# Patient Record
Sex: Female | Born: 1950 | Hispanic: No | Marital: Married | Smoking: Never smoker
Health system: Western US, Community
[De-identification: ages and names within clinical notes are randomized; demographics above are authoritative.]

---

## 2008-05-03 LAB — CBC WITH AUTO DIFFERENTIAL
Basophils %: 1 % (ref 0–2)
Basophils, Absolute: 0 10*3/uL (ref 0–0.2)
Eosinophils %: 1 % (ref 0–7)
Eosinophils, Absolute: 0.1 10*3/uL (ref 0–0.7)
HCT: 38 % (ref 37.0–48.0)
Hgb: 13.3 gm/dL (ref 12.0–16.0)
Lymphocytes %: 48 % — ABNORMAL HIGH (ref 25–45)
Lymphocytes, Absolute: 3.3 10*3/uL (ref 1.1–4.3)
MCH: 31.6 pg (ref 27–34)
MCHC: 35 gm/dL (ref 32–36)
MCV: 90.4 fL (ref 81–99)
MPV: 8.9 fL (ref 7.4–10.4)
Monocytes %: 7 % (ref 0–12)
Monocytes, Absolute: 0.5 10*3/uL (ref 0–1.2)
Neutrophils, Absolute: 2.9 10*3/uL (ref 1.6–7.3)
Platelet Count: 264 10*3/uL (ref 150–400)
RBC: 4.2 10*6/uL (ref 4.20–5.40)
RDW: 12.6 % (ref 11.5–14.5)
Segs (Neutrophils)%: 43 % (ref 35–70)
WBC: 6.8 10*3/uL (ref 4.8–10.8)

## 2008-05-03 LAB — BASIC METABOLIC PANEL
Anion Gap: 8 mmol/L (ref 3–11)
BUN: 15 mg/dL (ref 8–21)
CO2 - Carbon Dioxide: 29 mmol/L (ref 22–31)
Calcium: 9.5 mg/dL (ref 8.5–10.5)
Chloride: 102 mmol/L (ref 98–111)
Creatinine, Serum: 0.8 mg/dL (ref 0.6–1.3)
GFR Estimate: >60 mL/min/{1.73_m2} (ref >60)
Glucose: 110 mg/dL — ABNORMAL HIGH (ref 80–99)
Potassium: 4.1 mmol/L (ref 3.5–5.1)
Sodium: 139 mmol/L (ref 133–147)

## 2012-11-28 ENCOUNTER — Ambulatory Visit: Admit: 2012-11-28 | Discharge: 2012-11-28 | Payer: BC Managed Care – PPO

## 2012-11-28 DIAGNOSIS — Z139 Encounter for screening, unspecified: Secondary | ICD-10-CM

## 2013-01-23 ENCOUNTER — Inpatient Hospital Stay: Admit: 2013-01-23 | Payer: BC Managed Care – PPO

## 2013-01-23 ENCOUNTER — Ambulatory Visit: Admit: 2013-01-23 | Payer: BC Managed Care – PPO

## 2013-01-23 DIAGNOSIS — D3A090 Benign carcinoid tumor of the bronchus and lung: Secondary | ICD-10-CM

## 2013-01-23 LAB — PLATELET COUNT: Platelet Count: 237 10*3/uL (ref 150–400)

## 2013-01-23 LAB — PROTIME-INR
INR: 1 (ref 0.9–1.2)
Protime: 10.5 s (ref 10.0–13.0)

## 2013-01-23 LAB — APTT: APTT: 29 s (ref 23–34)

## 2013-01-23 MED ORDER — morphine syringe 4 mg
4 | INTRAMUSCULAR | Status: DC | PRN
Start: 2013-01-23 — End: 2013-01-23

## 2013-01-23 MED ORDER — fentaNYL (SUBLIMAZE) 50 mcg/mL injection
50 | INTRAMUSCULAR | Status: DC
Start: 2013-01-23 — End: 2013-01-23

## 2013-01-23 MED ORDER — HYDROcodone-acetaminophen (NORCO) 5-325 mg per tablet 1-2 tablet
5-325 | ORAL | Status: DC | PRN
Start: 2013-01-23 — End: 2013-01-23

## 2013-01-23 MED ORDER — HYDROcodone-acetaminophen (NORCO) 5-325 mg per tablet
5-325 | ORAL_TABLET | ORAL | 0.00 refills | Status: AC | PRN
Start: 2013-01-23 — End: 2013-02-02

## 2013-01-23 MED ORDER — ondansetron (ZOFRAN) 4 mg/2 mL injection 4 mg
4 | INTRAMUSCULAR | Status: DC | PRN
Start: 2013-01-23 — End: 2013-01-23
  Administered 2013-01-23: 20:00:00 4 mg via INTRAVENOUS

## 2013-01-23 MED ORDER — midazolam (VERSED) 1 mg/mL injection
1 | INTRAMUSCULAR | Status: AC
Start: 2013-01-23 — End: 2013-01-23
  Administered 2013-01-23: 19:00:00 1 mg/mL

## 2013-01-23 MED ORDER — fentaNYL (SUBLIMAZE) 50 mcg/mL injection
50 | INTRAMUSCULAR | Status: AC
Start: 2013-01-23 — End: 2013-01-23
  Administered 2013-01-23: 19:00:00 50 mcg/mL

## 2013-01-23 MED FILL — ONDANSETRON HCL (PF) 4 MG/2 ML INJECTION SOLUTION: 4 | INTRAMUSCULAR | Qty: 2 | Fill #0

## 2013-01-23 MED FILL — MIDAZOLAM (PF) 1 MG/ML INJECTION SOLUTION: 1 mg/mL | INTRAMUSCULAR | Qty: 5 | Fill #0

## 2013-01-23 MED FILL — FENTANYL (PF) 50 MCG/ML INJECTION SOLUTION: 50 ug/mL | INTRAMUSCULAR | Qty: 2 | Fill #0

## 2013-01-28 ENCOUNTER — Ambulatory Visit: Admit: 2013-01-28 | Discharge: 2013-01-28 | Payer: BC Managed Care – PPO

## 2013-01-28 DIAGNOSIS — R911 Solitary pulmonary nodule: Secondary | ICD-10-CM

## 2013-02-19 ENCOUNTER — Inpatient Hospital Stay: Admit: 2013-02-19 | Payer: BC Managed Care – PPO

## 2013-02-19 DIAGNOSIS — R911 Solitary pulmonary nodule: Secondary | ICD-10-CM

## 2013-02-19 MED ORDER — iohexol (OMNIPAQUE) 350 mg iodine/mL injection 125 mL
350 | Freq: Once | INTRAVENOUS | Status: AC | PRN
Start: 2013-02-19 — End: 2013-02-19
  Administered 2013-02-19: 18:00:00 350 mL via INTRAVENOUS

## 2013-04-03 ENCOUNTER — Ambulatory Visit: Admit: 2013-04-03 | Discharge: 2013-04-03 | Payer: BC Managed Care – PPO | Attending: MD

## 2013-04-03 DIAGNOSIS — C342 Malignant neoplasm of middle lobe, bronchus or lung: Secondary | ICD-10-CM

## 2013-04-22 ENCOUNTER — Encounter: Payer: BC Managed Care – PPO | Attending: Family

## 2013-07-22 ENCOUNTER — Encounter: Payer: BC Managed Care – PPO | Attending: Family

## 2013-08-05 ENCOUNTER — Inpatient Hospital Stay: Admit: 2013-08-05 | Payer: BC Managed Care – PPO | Attending: MD

## 2013-08-05 ENCOUNTER — Ambulatory Visit: Admit: 2013-08-05 | Payer: BC Managed Care – PPO | Attending: Family

## 2013-08-05 ENCOUNTER — Inpatient Hospital Stay: Admit: 2013-08-05 | Payer: BC Managed Care – PPO | Attending: Family

## 2013-08-05 DIAGNOSIS — C3490 Malignant neoplasm of unspecified part of unspecified bronchus or lung: Secondary | ICD-10-CM

## 2013-08-05 LAB — CBC WITH AUTO DIFFERENTIAL
Basophils %: 1 % (ref 0–2)
Basophils, Absolute: 0.1 10*3/uL (ref 0–0.2)
Eosinophils %: 2 % (ref 0–7)
Eosinophils, Absolute: 0.1 10*3/uL (ref 0–0.7)
HCT: 38.7 % (ref 37.0–48.0)
Hgb: 13.4 gm/dL (ref 12.0–16.0)
Lymphocytes %: 41 % (ref 25–45)
Lymphocytes, Absolute: 2.5 10*3/uL (ref 1.1–4.3)
MCH: 31.4 pg (ref 27–34)
MCHC: 34.5 gm/dL (ref 32–36)
MCV: 91 fL (ref 81–99)
MPV: 9.7 fL (ref 7.4–10.4)
Monocytes %: 6 % (ref 0–12)
Monocytes, Absolute: 0.4 10*3/uL (ref 0–1.2)
Neutrophils, Absolute: 3.1 10*3/uL (ref 1.6–7.3)
Platelet Count: 240 10*3/uL (ref 150–400)
RBC: 4.26 10*6/uL (ref 4.20–5.40)
RDW: 13.2 % (ref 11.5–14.5)
Segs (Neutrophils)%: 50 % (ref 35–70)
WBC: 6.1 10*3/uL (ref 4.8–10.8)

## 2013-08-05 LAB — COMPREHENSIVE METABOLIC PANEL
ALT - Alanine Amino transferase: 27 IU/L (ref 10–45)
AST - Aspartate Aminotransferase: 29 IU/L (ref 10–42)
Albumin/Globulin Ratio: 1.3 (ref >0.9)
Albumin: 4.2 gm/dL (ref 3.5–5.0)
Alkaline Phosphatase: 47 IU/L (ref 37–107)
Anion Gap: 7 mmol/L (ref 3–11)
BUN: 13 mg/dL (ref 8–21)
Bilirubin, Total: 0.6 mg/dL (ref 0.2–1.4)
CO2 - Carbon Dioxide: 28 mmol/L (ref 22–31)
Calcium: 9.7 mg/dL (ref 8.6–10.0)
Chloride: 105 mmol/L (ref 98–111)
Creatinine, Serum: 0.88 mg/dL (ref 0.44–1.03)
GFR Estimate: >60 mL/min/{1.73_m2} (ref >60)
Globulin: 3.2 gm/dL (ref 2.2–3.7)
Glucose: 89 mg/dL (ref 80–99)
Potassium: 3.9 mmol/L (ref 3.5–5.1)
Protein, Total: 7.4 gm/dL (ref 6.1–7.9)
Sodium: 140 mmol/L (ref 135–143)

## 2013-08-05 LAB — TYPE AND SCREEN
ABO/Rh (D): A POS
Antibody Screen: NEGATIVE
Unit Division: 0
Unit Division: 0

## 2013-08-05 LAB — PROTIME-INR
INR: 1 (ref 0.9–1.2)
Protime: 10.8 s (ref 10.0–13.0)

## 2013-08-05 LAB — MRSA BY PCR: MRSA DNA Result: NEGATIVE

## 2013-08-06 ENCOUNTER — Inpatient Hospital Stay: Admit: 2013-08-06 | Payer: BC Managed Care – PPO

## 2013-08-06 ENCOUNTER — Inpatient Hospital Stay
Admit: 2013-08-06 | Discharge: 2013-08-09 | Disposition: A | Discharge status: Home / Self Care | Payer: BC Managed Care – PPO | Source: Ambulatory Visit | Attending: MD | Admitting: MD

## 2013-08-06 DIAGNOSIS — C7A090 Malignant carcinoid tumor of the bronchus and lung: Secondary | ICD-10-CM

## 2013-08-06 LAB — VRE CULTURE

## 2013-08-06 MED ORDER — magnesium hydroxide (MILK OF MAGNESIA) 400 mg/5 mL oral suspension 30 mL
400 | ORAL | Status: DC | PRN
Start: 2013-08-06 — End: 2013-08-06

## 2013-08-06 MED ORDER — ceFAZolin (ANCEF) in D5W IVPB 2 g/50 mL
2 | Freq: Three times a day (TID) | INTRAVENOUS | Status: AC
Start: 2013-08-06 — End: 2013-08-07
  Administered 2013-08-07 (×2): 250 g via INTRAVENOUS

## 2013-08-06 MED ORDER — fentaNYL (SUBLIMAZE) 50 mcg/mL injection
50 | INTRAMUSCULAR | Status: DC | PRN
Start: 2013-08-06 — End: 2013-08-06

## 2013-08-06 MED ORDER — chlorhexidine (PERIDEX) 0.12 % solution
0.12 | Status: AC
Start: 2013-08-06 — End: ?

## 2013-08-06 MED ORDER — sodium chloride 0.9% (NS) syringe 5 mL
INTRAMUSCULAR | Status: DC | PRN
Start: 2013-08-06 — End: 2013-08-06

## 2013-08-06 MED ORDER — chlorhexidine (PERIDEX) 0.12 % solution 15 mL
0.12 | Freq: Once | Status: AC
Start: 2013-08-06 — End: 2013-08-06

## 2013-08-06 MED ORDER — aluminum-magnesium hydroxide-simethicone (MAALOX PLUS) 200-200-20 mg/5 mL oral suspension 30 mL
200-200-20 | ORAL | Status: DC | PRN
Start: 2013-08-06 — End: 2013-08-06

## 2013-08-06 MED ORDER — propofol (DIPRIVAN) injection
10 | INTRAVENOUS | Status: DC | PRN
Start: 2013-08-06 — End: 2013-08-06

## 2013-08-06 MED ORDER — ondansetron (ZOFRAN) injection 4 mg
4 | Freq: Four times a day (QID) | INTRAMUSCULAR | Status: DC | PRN
Start: 2013-08-06 — End: 2013-08-08

## 2013-08-06 MED ORDER — magnesium hydroxide (MILK OF MAGNESIA) 400 mg/5 mL oral suspension 30 mL
400 | ORAL | Status: DC | PRN
Start: 2013-08-06 — End: 2013-08-07

## 2013-08-06 MED ORDER — acetaminophen (TYLENOL) tablet 650 mg
325 | ORAL | Status: DC | PRN
Start: 2013-08-06 — End: 2013-08-09

## 2013-08-06 MED ORDER — ceFAZolin (ANCEF) IV syringe 2 g
2 | Freq: Once | INTRAVENOUS | Status: DC
Start: 2013-08-06 — End: 2013-08-06

## 2013-08-06 MED ORDER — Normal saline infusion for medication/blood product administration for nursing
0.9 | INTRAVENOUS | Status: DC | PRN
Start: 2013-08-06 — End: 2013-08-07

## 2013-08-06 MED ORDER — dextrose 5 % in lactated ringers infusion
INTRAVENOUS | Status: DC | PRN
Start: 2013-08-06 — End: 2013-08-07

## 2013-08-06 MED ORDER — nalOXone (NARCAN) injection 0.08 mg
0.4 | INTRAMUSCULAR | Status: DC | PRN
Start: 2013-08-06 — End: 2013-08-08

## 2013-08-06 MED ORDER — midazolam (VERSED) 1 mg/mL injection
1 | INTRAMUSCULAR | Status: AC
Start: 2013-08-06 — End: ?

## 2013-08-06 MED ORDER — ceFAZolin (ANCEF) IV syringe 1 g
1 | INTRAVENOUS | Status: DC | PRN
Start: 2013-08-06 — End: 2013-08-06

## 2013-08-06 MED ORDER — metoprolol (LOPRESSOR) tablet 25 mg
50 | Freq: Once | ORAL | Status: DC
Start: 2013-08-06 — End: 2013-08-06

## 2013-08-06 MED ORDER — sodium chloride 0.9% (NS) syringe 5 mL
Freq: Three times a day (TID) | INTRAMUSCULAR | Status: DC
Start: 2013-08-06 — End: 2013-08-06

## 2013-08-06 MED ORDER — acetaminophen (TYLENOL) tablet 650 mg
325 | ORAL | Status: DC | PRN
Start: 2013-08-06 — End: 2013-08-06

## 2013-08-06 MED ORDER — sodium phosphates (FLEETS) 19-7 gram/118 mL enema 133 mL
19 | Freq: Once | RECTAL | Status: DC
Start: 2013-08-06 — End: 2013-08-06

## 2013-08-06 MED ORDER — ALPRAZolam (XANAX) tablet 0.25 mg
0.25 | Freq: Four times a day (QID) | ORAL | Status: DC | PRN
Start: 2013-08-06 — End: 2013-08-06

## 2013-08-06 MED ORDER — oxyCODONE (ROXICODONE) tablet 5-10 mg
5 | ORAL | Status: DC | PRN
Start: 2013-08-06 — End: 2013-08-06

## 2013-08-06 MED ORDER — sodium chloride 0.9% (NS) syringe 5 mL
INTRAMUSCULAR | Status: DC | PRN
Start: 2013-08-06 — End: 2013-08-07

## 2013-08-06 MED ORDER — ondansetron (ZOFRAN) injection
4 | INTRAMUSCULAR | Status: DC | PRN
Start: 2013-08-06 — End: 2013-08-06

## 2013-08-06 MED ORDER — rocuronium (ZEMURON) injection
10 | INTRAVENOUS | Status: DC | PRN
Start: 2013-08-06 — End: 2013-08-06

## 2013-08-06 MED ORDER — prochlorperazine (COMPAZINE) injection 10 mg
10 | Freq: Four times a day (QID) | INTRAMUSCULAR | Status: DC | PRN
Start: 2013-08-06 — End: 2013-08-06

## 2013-08-06 MED ORDER — fentaNYL (SUBLIMAZE) 50 mcg/mL injection 25-50 mcg
50 | INTRAMUSCULAR | Status: DC | PRN
Start: 2013-08-06 — End: 2013-08-06

## 2013-08-06 MED ORDER — fentanyl-bupivacaine-nacl 4 mcg/mL-0.1 % epidural
EPIDURAL | Status: AC
Start: 2013-08-06 — End: ?

## 2013-08-06 MED ORDER — lidocaine (XYLOCAINE) 20 mg/mL (2 %) injection 92 mg
20 | INTRAVENOUS | Status: DC | PRN
Start: 2013-08-06 — End: 2013-08-07

## 2013-08-06 MED ORDER — temazepam (RESTORIL) capsule 15 mg
15 | Freq: Every evening | ORAL | Status: DC | PRN
Start: 2013-08-06 — End: 2013-08-06

## 2013-08-06 MED ORDER — ondansetron (ZOFRAN) injection 4 mg
4 | Freq: Four times a day (QID) | INTRAMUSCULAR | Status: DC | PRN
Start: 2013-08-06 — End: 2013-08-06

## 2013-08-06 MED ORDER — furosemide (LASIX) injection 20 mg
10 | INTRAMUSCULAR | Status: DC | PRN
Start: 2013-08-06 — End: 2013-08-07

## 2013-08-06 MED ORDER — phenylephrine (NEO-SYNEPHRINE) injection
10 | INTRAMUSCULAR | Status: DC | PRN
Start: 2013-08-06 — End: 2013-08-06

## 2013-08-06 MED ORDER — sodium chloride 0.9% irrigation
0.9 | Status: DC | PRN
Start: 2013-08-06 — End: 2013-08-06
  Administered 2013-08-06: 0.9 % irrigation

## 2013-08-06 MED ORDER — fentaNYL (SUBLIMAZE) 50 mcg/mL injection 50-100 mcg
50 | INTRAMUSCULAR | Status: DC | PRN
Start: 2013-08-06 — End: 2013-08-06

## 2013-08-06 MED ORDER — acetaminophen (TYLENOL) suppository 650 mg
650 | RECTAL | Status: DC | PRN
Start: 2013-08-06 — End: 2013-08-07

## 2013-08-06 MED ORDER — bupivacaine (PF) (MARCAINE) 0.25 % (2.5 mg/mL) injection
0.25 | INTRAMUSCULAR | Status: DC | PRN
Start: 2013-08-06 — End: 2013-08-06

## 2013-08-06 MED ORDER — sodium chloride 0.9% (NS) syringe 5-10 mL
INTRAMUSCULAR | Status: DC | PRN
Start: 2013-08-06 — End: 2013-08-07

## 2013-08-06 MED ORDER — lidocaine 10 mg/mL (1 %) injection
10 | INTRAMUSCULAR | Status: DC | PRN
Start: 2013-08-06 — End: 2013-08-06

## 2013-08-06 MED ORDER — meperidine (PF) (DEMEROL) syringe 12.5 mg
50 | INTRAMUSCULAR | Status: DC | PRN
Start: 2013-08-06 — End: 2013-08-06

## 2013-08-06 MED ORDER — chlorhexidine (PERIDEX) 0.12 % solution 15 mL
0.12 | Freq: Once | Status: DC
Start: 2013-08-06 — End: 2013-08-06

## 2013-08-06 MED ORDER — fentanyl-bupivacaine-nacl 4 mcg/mL-0.1 % epidural
4 | EPIDURAL | Status: DC
Start: 2013-08-06 — End: 2013-08-08
  Administered 2013-08-07 – 2013-08-08 (×3): 4-0.1 mL/h via EPIDURAL

## 2013-08-06 MED ORDER — glycopyrrolate (ROBINUL) injection
0.2 | INTRAMUSCULAR | Status: DC | PRN
Start: 2013-08-06 — End: 2013-08-06

## 2013-08-06 MED ORDER — heparin, porcine (PF) syringe 5,000 Units
5000 | Freq: Two times a day (BID) | INTRAMUSCULAR | Status: DC
Start: 2013-08-06 — End: 2013-08-09

## 2013-08-06 MED ORDER — sodium chloride 0.9 % infusion
INTRAVENOUS | Status: DC | PRN
Start: 2013-08-06 — End: 2013-08-06

## 2013-08-06 MED ORDER — diphenhydrAMINE (BENADRYL) injection 25 mg
50 | INTRAMUSCULAR | Status: DC | PRN
Start: 2013-08-06 — End: 2013-08-06

## 2013-08-06 MED ORDER — sodium chloride 0.9% (NS) syringe 5 mL
Freq: Three times a day (TID) | INTRAMUSCULAR | Status: DC
Start: 2013-08-06 — End: 2013-08-07

## 2013-08-06 MED ORDER — neostigmine (PROSTIGMIN) injection
1 | INTRAMUSCULAR | Status: DC | PRN
Start: 2013-08-06 — End: 2013-08-06

## 2013-08-06 MED ORDER — succinylcholine (ANECTINE) syringe
200 | INTRAVENOUS | Status: DC | PRN
Start: 2013-08-06 — End: 2013-08-06

## 2013-08-06 MED ORDER — simethicone (MYLICON) chewable tablet 80 mg
80 | ORAL | Status: DC | PRN
Start: 2013-08-06 — End: 2013-08-09

## 2013-08-06 MED ORDER — diphenhydrAMINE (BENADRYL) injection 12.5-25 mg
50 | Freq: Four times a day (QID) | INTRAMUSCULAR | Status: DC | PRN
Start: 2013-08-06 — End: 2013-08-08

## 2013-08-06 MED ORDER — diphenhydrAMINE (BENADRYL) capsule 25 mg
25 | Freq: Four times a day (QID) | ORAL | Status: DC | PRN
Start: 2013-08-06 — End: 2013-08-08

## 2013-08-06 MED ORDER — magnesium sulfate in SW IVPB 2 g 50 mL
2 | INTRAVENOUS | Status: DC | PRN
Start: 2013-08-06 — End: 2013-08-07

## 2013-08-06 MED ORDER — midazolam (VERSED) injection
1 | INTRAMUSCULAR | Status: DC | PRN
Start: 2013-08-06 — End: 2013-08-06

## 2013-08-06 MED FILL — CEFAZOLIN 2 GRAM/50 ML IN DEXTROSE (ISO-OSMOTIC) INTRAVENOUS PIGGYBACK: 2 gram/50 mL | INTRAVENOUS | Qty: 50 | Fill #0

## 2013-08-06 MED FILL — MIDAZOLAM (PF) 1 MG/ML INJECTION SOLUTION: 1 mg/mL | INTRAMUSCULAR | Qty: 5 | Fill #0

## 2013-08-06 MED FILL — FENTANYL-BUPIVACAINE-NACL (PF) 4 MCG/ML-0.1 % EPIDURAL PREMIX (CNR AVAILABLE): 4 mcg/mL-0.1 % | EPIDURAL | Qty: 1 | Fill #0

## 2013-08-06 MED FILL — CHLORHEXIDINE GLUCONATE 0.12 % MOUTHWASH: 0.12 % | Qty: 15 | Fill #0

## 2013-08-07 ENCOUNTER — Inpatient Hospital Stay: Admit: 2013-08-07 | Payer: BC Managed Care – PPO

## 2013-08-07 LAB — BASIC METABOLIC PANEL
Anion Gap: 7 mmol/L (ref 3–11)
BUN: 9 mg/dL (ref 8–21)
CO2 - Carbon Dioxide: 24 mmol/L (ref 22–31)
Calcium: 8.1 mg/dL — ABNORMAL LOW (ref 8.6–10.0)
Chloride: 103 mmol/L (ref 98–111)
Creatinine, Serum: 0.61 mg/dL (ref 0.44–1.03)
GFR Estimate: >60 mL/min/{1.73_m2} (ref >60)
Glucose: 141 mg/dL — ABNORMAL HIGH (ref 80–99)
Potassium: 3.8 mmol/L (ref 3.5–5.1)
Sodium: 134 mmol/L — ABNORMAL LOW (ref 135–143)

## 2013-08-07 LAB — CBC WITH AUTO DIFFERENTIAL
Basophils %: 0 % (ref 0–2)
Basophils, Absolute: 0 10*3/uL (ref 0–0.2)
Eosinophils %: 0 % (ref 0–7)
Eosinophils, Absolute: 0 10*3/uL (ref 0–0.7)
HCT: 33.4 % — ABNORMAL LOW (ref 37.0–48.0)
Hgb: 11.5 gm/dL — ABNORMAL LOW (ref 12.0–16.0)
Lymphocytes %: 10 % — ABNORMAL LOW (ref 25–45)
Lymphocytes, Absolute: 1.1 10*3/uL (ref 1.1–4.3)
MCH: 31.3 pg (ref 27–34)
MCHC: 34.4 gm/dL (ref 32–36)
MCV: 91 fL (ref 81–99)
MPV: 9.4 fL (ref 7.4–10.4)
Monocytes %: 5 % (ref 0–12)
Monocytes, Absolute: 0.5 10*3/uL (ref 0–1.2)
Neutrophils, Absolute: 9.7 10*3/uL — ABNORMAL HIGH (ref 1.6–7.3)
Platelet Count: 192 10*3/uL (ref 150–400)
RBC: 3.67 10*6/uL — ABNORMAL LOW (ref 4.20–5.40)
RDW: 13.2 % (ref 11.5–14.5)
Segs (Neutrophils)%: 85 % — ABNORMAL HIGH (ref 35–70)
WBC: 11.3 10*3/uL — ABNORMAL HIGH (ref 4.8–10.8)

## 2013-08-07 MED ORDER — oxyCODONE (ROXICODONE) tablet 5-10 mg
5 | ORAL | Status: DC | PRN
Start: 2013-08-07 — End: 2013-08-07

## 2013-08-07 MED ORDER — sodium chloride 0.9% (NS) syringe 5 mL
Freq: Three times a day (TID) | INTRAMUSCULAR | Status: DC
Start: 2013-08-07 — End: 2013-08-09

## 2013-08-07 MED ORDER — sodium chloride 0.9% (NS) syringe 5 mL
INTRAMUSCULAR | Status: DC | PRN
Start: 2013-08-07 — End: 2013-08-09

## 2013-08-07 MED ORDER — albuterol (PROVENTIL) nebulizer solution 2.5 mg
2.5 | Freq: Two times a day (BID) | RESPIRATORY_TRACT | Status: DC
Start: 2013-08-07 — End: 2013-08-09

## 2013-08-07 MED ORDER — prochlorperazine (COMPAZINE) injection 10 mg
10 | Freq: Once | INTRAMUSCULAR | Status: AC
Start: 2013-08-07 — End: 2013-08-07

## 2013-08-07 MED FILL — PROCHLORPERAZINE EDISYLATE 10 MG/2 ML (5 MG/ML) INJECTION SOLUTION: 10 mg/2 mL (5 mg/mL) | INTRAMUSCULAR | Qty: 2 | Fill #0

## 2013-08-07 MED FILL — HEPARIN, PORCINE (PF) 5,000 UNIT/0.5 ML SYRINGE: 5000 unit/0.5 mL | INTRAMUSCULAR | Qty: 0.5 | Fill #0

## 2013-08-07 MED FILL — ONDANSETRON HCL (PF) 4 MG/2 ML INJECTION SOLUTION: 4 | INTRAMUSCULAR | Qty: 2 | Fill #0

## 2013-08-07 MED FILL — FENTANYL-BUPIVACAINE-NACL (PF) 4 MCG/ML-0.1 % EPIDURAL PREMIX (CNR AVAILABLE): 4 mcg/mL-0.1 % | EPIDURAL | Qty: 100 | Fill #0

## 2013-08-07 MED FILL — ALBUTEROL SULFATE 2.5 MG/3 ML (0.083 %) SOLUTION FOR NEBULIZATION: 2.5 mg/3 mL (0.083 %) | RESPIRATORY_TRACT | Qty: 3 | Fill #0

## 2013-08-07 MED FILL — CEFAZOLIN 2 GRAM/50 ML IN DEXTROSE (ISO-OSMOTIC) INTRAVENOUS PIGGYBACK: 2 gram/50 mL | INTRAVENOUS | Qty: 50 | Fill #0

## 2013-08-08 ENCOUNTER — Inpatient Hospital Stay: Admit: 2013-08-08 | Payer: BC Managed Care – PPO

## 2013-08-08 MED ORDER — HYDROcodone-acetaminophen (NORCO) 5-325 mg per tablet 1 tablet
5-325 | ORAL | Status: DC | PRN
Start: 2013-08-08 — End: 2013-08-09

## 2013-08-08 MED ORDER — HYDROcodone-acetaminophen (NORCO) 5-325 mg per tablet
5-325 | ORAL_TABLET | Freq: Four times a day (QID) | ORAL | 0.00 refills | 30.00000 days | Status: DC | PRN
Start: 2013-08-08 — End: 2013-08-09

## 2013-08-08 MED ORDER — polyethylene glycol (MIRALAX) packet 17 g
17 | Freq: Three times a day (TID) | ORAL | Status: DC | PRN
Start: 2013-08-08 — End: 2013-08-09

## 2013-08-08 MED ORDER — HYDROcodone-acetaminophen (NORCO) 5-325 mg per tablet
5-325 | ORAL_TABLET | ORAL | 0.00 refills | 30.00000 days | Status: DC | PRN
Start: 2013-08-08 — End: 2014-07-22

## 2013-08-08 MED FILL — FENTANYL-BUPIVACAINE-NACL (PF) 4 MCG/ML-0.1 % EPIDURAL PREMIX (CNR AVAILABLE): 4 mcg/mL-0.1 % | EPIDURAL | Qty: 100 | Fill #0

## 2013-08-08 MED FILL — HEPARIN, PORCINE (PF) 5,000 UNIT/0.5 ML SYRINGE: 5000 unit/0.5 mL | INTRAMUSCULAR | Qty: 0.5 | Fill #0

## 2013-08-08 MED FILL — MAPAP (ACETAMINOPHEN) 325 MG TABLET: 325 mg | ORAL | Qty: 2 | Fill #0

## 2013-08-09 ENCOUNTER — Inpatient Hospital Stay: Admit: 2013-08-09 | Payer: BC Managed Care – PPO

## 2013-08-09 MED ORDER — traMADol (ULTRAM) 50 mg tablet
50 | ORAL_TABLET | Freq: Four times a day (QID) | ORAL | 0.00 refills | 30.00000 days | Status: AC | PRN
Start: 2013-08-09 — End: 2013-08-19

## 2013-08-09 MED ORDER — albuterol (PROVENTIL) nebulizer solution 2.5 mg
2.5 | RESPIRATORY_TRACT | Status: DC | PRN
Start: 2013-08-09 — End: 2013-08-09

## 2013-08-09 MED ORDER — traMADol (ULTRAM) tablet 50-100 mg
50 | Freq: Four times a day (QID) | ORAL | Status: DC | PRN
Start: 2013-08-09 — End: 2013-08-09

## 2013-08-09 MED ORDER — oxyCODONE (ROXICODONE) tablet 5-10 mg
5 | ORAL | Status: DC | PRN
Start: 2013-08-09 — End: 2013-08-09

## 2013-08-09 MED FILL — HYDROCODONE 5 MG-ACETAMINOPHEN 325 MG TABLET: 5-325 mg | ORAL | Qty: 1 | Fill #0

## 2013-08-09 MED FILL — TRAMADOL 50 MG TABLET: 50 mg | ORAL | Qty: 1 | Fill #0

## 2013-08-09 MED FILL — MIRALAX 17 GRAM ORAL POWDER PACKET: 17 g | ORAL | Qty: 1 | Fill #0

## 2013-08-09 MED FILL — HEPARIN, PORCINE (PF) 5,000 UNIT/0.5 ML SYRINGE: 5000 unit/0.5 mL | INTRAMUSCULAR | Qty: 0.5 | Fill #0

## 2013-08-09 MED FILL — TRAMADOL 50 MG TABLET: 50 mg | ORAL | Qty: 2 | Fill #0

## 2013-08-28 ENCOUNTER — Encounter: Admit: 2013-08-28 | Discharge: 2013-08-28 | Payer: BC Managed Care – PPO | Attending: Family

## 2013-08-28 ENCOUNTER — Inpatient Hospital Stay: Admit: 2013-08-28 | Payer: BC Managed Care – PPO | Attending: MD

## 2013-08-28 DIAGNOSIS — C342 Malignant neoplasm of middle lobe, bronchus or lung: Secondary | ICD-10-CM

## 2014-06-27 NOTE — Telephone Encounter (Addendum)
06/27/14 T/C from pt to reschedule appt. Pt is a previous RS pt.  Scheduled pt with Dr. Gwenyth Bouillon. Can you please create the CT order RS ordered last year for pt. Pt is requesting CT to be done at [facility] same day of her appt. LR    1) Looked over pt's chart and pt was to follow up 6 weeks after her CT biopsy and it sounds like she was to have CXR. Im holding off on ordering CT. If Dr. Gwenyth Bouillon thinks its nessary he will order for pt. RS never ordered   Look at past orders , other orders and there was never a CT ordered by RS.

## 2014-06-30 NOTE — Telephone Encounter (Signed)
06/30/14 LM for pt to inform her of the CXR needing to be done prior to her appt. LR

## 2014-07-22 ENCOUNTER — Ambulatory Visit: Admit: 2014-07-22 | Discharge: 2014-07-22 | Payer: BC Managed Care – PPO | Attending: Critical Care Medicine

## 2014-07-22 DIAGNOSIS — C7A090 Malignant carcinoid tumor of the bronchus and lung: Secondary | ICD-10-CM

## 2014-07-22 NOTE — Progress Notes (Signed)
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  I reviewed her chest x-ray appears normal, there is no further follow-up is required from pulmonary standpoint.  We'll defer to oncology for routine post cancer screening as per their recommendation.    Screening for OSA: Patient is positive is now snoring, but she denies nocturnal apneas morning headaches working out sleepy or excessive daytime sleepiness. She is at low risk for having OSA based on the abovestudy is required.    Vaccines: Patient is due for a flu vaccine that she would like to defer for now.          HISTORY OF PRESENT ILLNESS:   This is a 63 y.o. female with a history of lung carcinoid was resected at less than a year ago by Dr. Dillard Cannon.  She has slowly recovered since the surgery and and feet is normal right now.  The patient denies any dyspnea, chest pain cough fevers or weight loss.   She denies any flushing heat or cold intolerance or palpitations.  She has never smoked, no environmental or occupational exposures of significance.  She denies any excessive daytime sleepiness morning headache going up unrefreshed or excessive daytime sleepiness.  She does have nighttime snoring.      Past Medical History   Diagnosis Date   . History of migraine headaches    . Lung nodule    . Osteopenia    . Diverticulitis    . History of colonic polyps    . Hemorrhoids    . Osteoarthritis    . Carcinoma of the skin, basal cell      left leg       Past Surgical History   Procedure Laterality Date   . Appendectomy  1980   . Knee surgery Right 1996   . Cholecystectomy  2002   . Thoracoscopy; mediastinal lymph node disection; middle lobectomy 08/06/2013     Performed by Johnsie Kindred at [facility] OR.       History     Social History   . Marital Status: Married    Spouse Name: N/A     Number of Children: N/A   . Years of Education: N/A     Social History Main Topics   . Smoking status: Never Smoker    . Smokeless tobacco: Never Used   . Alcohol Use: Yes      Comment: occasional   . Drug Use: No   . Sexual Activity:     Partners: Male     Other Topics Concern   . None     Social History Narrative    Married with 2 grown children. Works full time as a Production manager for [employer]. Formerly employed in [employer] and then transitioned to [employer] work. Is also trained as a bilingual aid.       Patient Active Problem List    Diagnosis SNOMED CT(R) Date Noted   . Malignant neoplasm middle lobe, bronchus or lung MALIGNANT NEOPLASM OF MIDDLE LOBE, BRONCHUS OR LUNG 08/06/2013   . Malignant neoplasm of bronchus and lung, unspecified site MALIGNANT TUMOR OF LUNG 07/31/2013   . Lung nodule LUNG MASS 11/28/2012   . Migraine MIGRAINE 11/28/2012   . Osteopenia OSTEOPENIA 11/28/2012   .  Diverticulosis DIVERTICULAR DISEASE 11/28/2012   . Hx of colonic polyps HISTORY OF POLYP OF COLON 11/28/2012   . Osteoarthritis OSTEOARTHRITIS 11/28/2012       Current Outpatient Prescriptions on File Prior to Visit   Medication Sig Dispense Refill   . acetaminophen (TYLENOL) 325 mg tablet Take 650 mg by mouth every 6 (six) hours as needed for Pain.       No current facility-administered medications on file prior to visit.       No Known Allergies    Family History   Problem Relation Age of Onset   . Cancer Mother    . Diabetes Mother    . Cancer Father    . Tobacco Dependence Father        History   Substance Use Topics   . Smoking status: Never Smoker    . Smokeless tobacco: Never Used   . Alcohol Use: Yes      Comment: occasional         REVIEW OF SYSTEMS:      Constitutional: No recent fevers, chills, weight gain, night sweats.   Neuro: No new headache, visual disturbance, new muscle weakness, imbalance, seizure, history of stroke.   ENT: No facial pain/pressure, rhinorrhea/bloody nasal  discharge, nasal stuffiness, difficulty swallowing, odynophagia, or change in voice/hoarseness.   Endocrine: No flushing, temperature Intolerance, symptomatic hypoglycemia, polyuria, or polydipsia.   Pulmonary:  SEE HPI, negative otherwise.  Sleep: No daytime fatigue, daytime sleepiness, AM sour brash, AM headache, AM dry mouth, or predominantly evening-time lower extremity uncomfortable sensation. No snoring or witnessed apneas.   Cardiovascular: No Orthopnea, Paroxysmal Nocturnal Dyspnea,   GI: No abdominal pain, nausea, vomiting, hematochezia, melena, heartburn, or reflux.   GU: No dysuria, hematuria, frequency, urgency.  Hematologic: No easy bruising, easy/excessive bleeding.  Musculoskeletal: No new arthritis, arthralgias, myalgias   Dermatologic: No new rashes, pruritis, discoloration       PHYSICAL EXAMINATION:    Filed Vitals:    07/22/14 1543   BP: 114/63   Pulse: 70   Temp: 36.8 ?C (98.2 ?F)   TempSrc: Temporal   Height: 5' 2 (1.575 m)   Weight: 133 lb 12.8 oz (60.691 kg)   SpO2: 98%    Body mass index is 24.47 kg/(m^2).      GENERAL APPEARANCE:  Comfortable, in no acute distress.  HEAD: Normocephalic.  EYES:  PERRLA, no pallor, mot jaundiced.  ENT, MOUTH:  Normal external ear canal, normal oral mucosa, no thrush.  NECK:  Supple, no LAP  RESPIRATORY:  Good air entry bilateral,   CARDIOVASCULAR: NL S1 and S2, no M/R/G.  GASTROINTESTINAL:  Soft and lax, no swelling, no tenderness, no organomegaly.  MUSCULOSKELETAL: no joint swelling, no redness.  SKIN:  No skin rashes, or lesions.  NEUROLOGICAL:  CN 2-12 intact, no focal weakness, no cerebellar signs.  PSYCHIATRIC:  Normal affect, normal thought process, no signs of depression.  HEMATOLOGIC/LYMPHATIC/IMMUNOLOGIC: No LAP , no ecchymosis      DIAGNOSITIC STUDIES: (Personally reviewed)    Chest x-ray was reviewed and shows no acute cardiopulmonary disease other than a scar from the right middle lobe lobectomy.              Elyn Peers, MD    CC: Dr. Dillard Cannon,  Dr. Nicanor Alcon    I spent 45 minutes with he patient more than half of that time was spent on counseling the patient regarding his condition.    This note was transcribed using speech recognition software. Please  contact us for clarification if any questions arise relating to the wording of this document.

## 2015-02-16 NOTE — Procedures (Signed)
H&P dated 02/12/15, PARQ included, in chart review, notes.  Consent completed, in chart review, media.  Orders in place.

## 2015-02-23 ENCOUNTER — Inpatient Hospital Stay
Admit: 2015-02-23 | Discharge: 2015-02-24 | Disposition: A | Discharge status: Home / Self Care | Payer: Commercial Managed Care - PPO | Source: Ambulatory Visit | Attending: Adult Reconstructive Orthopaedic Surgery | Admitting: Adult Reconstructive Orthopaedic Surgery

## 2015-02-23 ENCOUNTER — Inpatient Hospital Stay: Admit: 2015-02-23 | Payer: Commercial Managed Care - PPO

## 2015-02-23 DIAGNOSIS — M1711 Unilateral primary osteoarthritis, right knee: Secondary | ICD-10-CM

## 2015-02-23 MED ORDER — ceFAZolin (ANCEF) 2 g in D5W IVPB Premix
2 | INTRAVENOUS | Status: DC | PRN
Start: 2015-02-23 — End: 2015-02-23
  Administered 2015-02-23 (×2): 2 g via INTRAVENOUS

## 2015-02-23 MED ORDER — phenylephrine (NEO-SYNEPHRINE) injection
10 | INTRAMUSCULAR | Status: DC | PRN
Start: 2015-02-23 — End: 2015-02-24
  Administered 2015-02-23 (×2): 10 mg/mL via INTRAVENOUS

## 2015-02-23 MED ORDER — ropivacaine (PF) (NAROPIN) 2 mg/mL (0.2 %) infusion
2 | INTRAMUSCULAR | Status: DC | PRN
Start: 2015-02-23 — End: 2015-02-24
  Administered 2015-02-23: 20:00:00 2 mg/mL (0. %) via EPIDURAL

## 2015-02-23 MED ORDER — chloroprocaine (PF) (NESACAINE) 30 mg/mL (3 %) injection
30 | INTRAMUSCULAR | Status: AC
Start: 2015-02-23 — End: ?

## 2015-02-23 MED ORDER — sodium chloride (NS) 0.9% irrigation
0.9 | Status: DC | PRN
Start: 2015-02-23 — End: 2015-02-23
  Administered 2015-02-23 (×2): 0.9 % irrigation

## 2015-02-23 MED ORDER — polyethylene glycol (MIRALAX) 17 gram packet
17 | Freq: Two times a day (BID) | ORAL | 0.00 refills | 15.00000 days | Status: DC
Start: 2015-02-23 — End: 2019-05-01

## 2015-02-23 MED ORDER — tranexamic acid injection 2,000 mg
1000 | Freq: Once | INTRAVENOUS | Status: AC
Start: 2015-02-23 — End: 2015-02-23
  Administered 2015-02-23 (×2): via INTRAVENOUS

## 2015-02-23 MED ORDER — lactated ringers infusion
INTRAVENOUS | Status: DC
Start: 2015-02-23 — End: 2015-02-23
  Administered 2015-02-23 (×2): via INTRAVENOUS

## 2015-02-23 MED ORDER — sodium chloride 0.9% 0.9 % IVPB
INTRAVENOUS | Status: AC
Start: 2015-02-23 — End: ?

## 2015-02-23 MED ORDER — oxyCODONE (ROXICODONE) 5 MG tablet
5 | ORAL | Status: AC
Start: 2015-02-23 — End: ?

## 2015-02-23 MED ORDER — oxyCODONE (ROXICODONE) tablet 2.5-10 mg
5 | ORAL | Status: DC | PRN
Start: 2015-02-23 — End: 2015-02-23
  Administered 2015-02-23: 23:00:00 5 mg via ORAL

## 2015-02-23 MED ORDER — sodium chloride 0.9% (NS PF) injection
INTRAMUSCULAR | Status: DC | PRN
Start: 2015-02-23 — End: 2015-02-23
  Administered 2015-02-23: 22:00:00 via SUBCUTANEOUS

## 2015-02-23 MED ORDER — acetaminophen (TYLENOL 8 HOUR) 650 MG CR tablet
650 | ORAL_TABLET | ORAL | 0.00 refills | 5.00000 days | Status: DC
Start: 2015-02-23 — End: 2019-05-01

## 2015-02-23 MED ORDER — ePHEDrine injection
50 | INTRAMUSCULAR | Status: DC | PRN
Start: 2015-02-23 — End: 2015-02-24
  Administered 2015-02-23 (×2): 50 mg/mL via INTRAVENOUS

## 2015-02-23 MED ORDER — promethazine (PHENERGAN) tablet 12.5 mg
25 | Freq: Once | ORAL | Status: AC
Start: 2015-02-23 — End: 2015-02-23
  Administered 2015-02-23: 19:00:00 25 mg via ORAL

## 2015-02-23 MED ORDER — bupivacaine (MARCAINE) 0.5 %-30 mL, epinephrine (ADRENALIN) 1:1000-0.3 mg, ketorolac (TORADOL) 15 mg
1 | Freq: Once | INTRAMUSCULAR | Status: AC
Start: 2015-02-23 — End: 2015-02-23
  Administered 2015-02-23: 22:00:00 via INTRAMUSCULAR

## 2015-02-23 MED ORDER — dexamethasone sodium phos (PF) (DECADRON) 10 mg/mL injection
10 | INTRAMUSCULAR | Status: AC
Start: 2015-02-23 — End: ?

## 2015-02-23 MED ORDER — acetaminophen (TYLENOL) 325 mg tablet
325 | ORAL | Status: AC
Start: 2015-02-23 — End: ?

## 2015-02-23 MED ORDER — oxyCODONE (ROXICODONE) tablet 2.5 mg
5 | Freq: Once | ORAL | Status: AC
Start: 2015-02-23 — End: 2015-02-23
  Administered 2015-02-23: 19:00:00 5 mg via ORAL

## 2015-02-23 MED ORDER — promethazine (PHENERGAN) 25 MG tablet
25 | ORAL | Status: AC
Start: 2015-02-23 — End: ?

## 2015-02-23 MED ORDER — chloroprocaine (PF) (NESACAINE) 30 mg/mL (3 %) injection
30 | INTRAMUSCULAR | Status: DC | PRN
Start: 2015-02-23 — End: 2015-02-24
  Administered 2015-02-23: 21:00:00 30 mg/mL (3 %) via EPIDURAL

## 2015-02-23 MED ORDER — midazolam (VERSED) injection
5 | INTRAMUSCULAR | Status: DC | PRN
Start: 2015-02-23 — End: 2015-02-24
  Administered 2015-02-23 (×2): 5 mg/mL via INTRAVENOUS

## 2015-02-23 MED ORDER — sodium chloride (NS) with polymyxin B, bacitracin Omnicell Med
50000 | INTRAMUSCULAR | Status: AC
Start: 2015-02-23 — End: ?

## 2015-02-23 MED ORDER — warfarin (COUMADIN) 2.5 MG tablet
2.5 | ORAL | Status: AC
Start: 2015-02-23 — End: ?

## 2015-02-23 MED ORDER — tranexamic acid 1,000 mg/10 mL (100 mg/mL) injection
1000 | INTRAVENOUS | Status: AC
Start: 2015-02-23 — End: ?

## 2015-02-23 MED ORDER — aspirin EC EC tablet 325 mg
325 | Freq: Once | ORAL | Status: AC
Start: 2015-02-23 — End: 2015-02-23
  Administered 2015-02-23: 19:00:00 325 mg via ORAL

## 2015-02-23 MED ORDER — ceFAZolin (ANCEF) 2 gram/50 mL in D5W IVPB Premix
2 | INTRAVENOUS | Status: AC
Start: 2015-02-23 — End: ?

## 2015-02-23 MED ORDER — vancomycin (VANCOCIN) injection
1000 | INTRAVENOUS | Status: DC | PRN
Start: 2015-02-23 — End: 2015-02-23
  Administered 2015-02-23: 22:00:00

## 2015-02-23 MED ORDER — oxyCODONE (ROXICODONE) tablet 2.5 mg
5 | Freq: Four times a day (QID) | ORAL | Status: DC
Start: 2015-02-23 — End: 2015-02-23

## 2015-02-23 MED ORDER — meloxicam (MOBIC) 7.5 MG tablet
7.5 | ORAL | Status: AC
Start: 2015-02-23 — End: ?

## 2015-02-23 MED ORDER — ondansetron (ZOFRAN) injection 4 mg
4 | Freq: Once | INTRAMUSCULAR | Status: AC
Start: 2015-02-23 — End: 2015-02-23
  Administered 2015-02-23: 19:00:00 4 mg via INTRAVENOUS

## 2015-02-23 MED ORDER — meloxicam (MOBIC) tablet 15 mg
7.5 | Freq: Once | ORAL | Status: AC
Start: 2015-02-23 — End: 2015-02-23
  Administered 2015-02-23: 19:00:00 7.5 mg via ORAL

## 2015-02-23 MED ORDER — vancomycin (VANCOCIN) 1,000 mg injection
1000 | INTRAVENOUS | Status: AC
Start: 2015-02-23 — End: ?

## 2015-02-23 MED ORDER — lactated ringers infusion
INTRAVENOUS | Status: AC
Start: 2015-02-23 — End: ?

## 2015-02-23 MED ORDER — bupivacaine liposomal (PF) (EXPAREL) 266 mg/20 mL (13.3 mg/mL) injection
1.3 | Status: AC
Start: 2015-02-23 — End: ?

## 2015-02-23 MED ORDER — ePHEDrine sulfate in NS (PF) 50 mg/5 mL (10 mg/mL) syringe
50 | INTRAVENOUS | Status: AC
Start: 2015-02-23 — End: ?

## 2015-02-23 MED ORDER — fentaNYL (PF) (SUBLIMAZE) 50 mcg/mL injection 50-100 mcg
50 | INTRAMUSCULAR | Status: DC | PRN
Start: 2015-02-23 — End: 2015-02-23

## 2015-02-23 MED ORDER — ondansetron (ZOFRAN) injection
4 | INTRAMUSCULAR | Status: DC | PRN
Start: 2015-02-23 — End: 2015-02-24
  Administered 2015-02-23: 22:00:00 4 mg/2 mL via INTRAVENOUS

## 2015-02-23 MED ORDER — fentaNYL (PF) (SUBLIMAZE) 50 mcg/mL injection
50 | INTRAMUSCULAR | Status: DC | PRN
Start: 2015-02-23 — End: 2015-02-24
  Administered 2015-02-23 (×3): 50 mcg/mL via INTRAVENOUS

## 2015-02-23 MED ORDER — sodium chloride 0.9 % (NS) infusion
INTRAVENOUS | Status: AC
Start: 2015-02-23 — End: ?

## 2015-02-23 MED ORDER — ketorolac (TORADOL) injection 15 mg
30 | Freq: Four times a day (QID) | INTRAMUSCULAR | Status: DC | PRN
Start: 2015-02-23 — End: 2015-02-23

## 2015-02-23 MED ORDER — warfarin (COUMADIN) tablet 1.25 mg
2.5 | Freq: Every day | ORAL | Status: DC
Start: 2015-02-23 — End: 2015-02-23
  Administered 2015-02-23: 23:00:00 2.5 mg via ORAL

## 2015-02-23 MED ORDER — omeprazole (PriLOSEC) capsule 20 mg
20 | Freq: Once | ORAL | Status: AC
Start: 2015-02-23 — End: 2015-02-23
  Administered 2015-02-23: 19:00:00 20 mg via ORAL

## 2015-02-23 MED ORDER — ketorolac (TORADOL) injection 15 mg
30 | Freq: Once | INTRAMUSCULAR | Status: AC
Start: 2015-02-23 — End: 2015-02-23
  Administered 2015-02-23 (×2): 30 mg via INTRAVENOUS

## 2015-02-23 MED ORDER — docusate sodium (COLACE) 100 MG capsule
100 | ORAL_CAPSULE | Freq: Two times a day (BID) | ORAL | 0.00 refills | 30.00000 days | Status: DC
Start: 2015-02-23 — End: 2019-05-01

## 2015-02-23 MED ORDER — midazolam (PF) (VERSED) 1 mg/mL injection
1 | INTRAMUSCULAR | Status: AC
Start: 2015-02-23 — End: ?

## 2015-02-23 MED ORDER — propofol (DIPRIVAN) injection
10 | INTRAVENOUS | Status: DC | PRN
Start: 2015-02-23 — End: 2015-02-24
  Administered 2015-02-23 (×4): 10 mg/mL via INTRAVENOUS

## 2015-02-23 MED ORDER — cephalexin (KEFLEX) 500 MG capsule
500 | ORAL_CAPSULE | Freq: Four times a day (QID) | ORAL | Status: AC
Start: 2015-02-23 — End: 2015-03-01

## 2015-02-23 MED ORDER — ketorolac (TORADOL) 30 mg/mL (1 mL) injection
30 | INTRAMUSCULAR | Status: AC
Start: 2015-02-23 — End: ?

## 2015-02-23 MED ORDER — meloxicam (MOBIC) 15 MG tablet
15 | ORAL_TABLET | Freq: Every day | ORAL | Status: AC
Start: 2015-02-23 — End: 2015-05-23

## 2015-02-23 MED ORDER — diphenhydrAMINE (BENADRYL) injection 25 mg
50 | INTRAMUSCULAR | Status: DC | PRN
Start: 2015-02-23 — End: 2015-02-23

## 2015-02-23 MED ORDER — aspirin 325 MG tablet
325 | ORAL_TABLET | Freq: Every day | ORAL | 0.00 refills | 30.00000 days | Status: DC
Start: 2015-02-23 — End: 2019-05-01

## 2015-02-23 MED ORDER — bupivacaine liposomal (PF) (EXPAREL) 266 mg/20 mL (13.3 mg/mL) injection
1.3 | Status: DC | PRN
Start: 2015-02-23 — End: 2015-02-23
  Administered 2015-02-23: 22:00:00 1.3 % (13.3 mg/mL)

## 2015-02-23 MED ORDER — fentaNYL (PF) (SUBLIMAZE) 50 mcg/mL injection
50 | INTRAMUSCULAR | Status: AC
Start: 2015-02-23 — End: ?

## 2015-02-23 MED ORDER — omeprazole (PRILOSEC) 20 MG capsule
20 | ORAL_CAPSULE | Freq: Every day | ORAL | 1.00 refills | 90.00000 days | Status: AC
Start: 2015-02-23 — End: 2015-05-23

## 2015-02-23 MED ORDER — omeprazole (PriLOSEC) 20 MG capsule
20 | ORAL | Status: AC
Start: 2015-02-23 — End: ?

## 2015-02-23 MED ORDER — ondansetron (ZOFRAN) 4 mg/2 mL injection
4 | INTRAMUSCULAR | Status: AC
Start: 2015-02-23 — End: ?

## 2015-02-23 MED ORDER — dexamethasone sodium phos (PF) (DECADRON) 10 mg/mL injection 10 mg
10 | Freq: Once | INTRAMUSCULAR | Status: AC
Start: 2015-02-23 — End: 2015-02-23
  Administered 2015-02-23: 19:00:00 10 mg via INTRAVENOUS

## 2015-02-23 MED ORDER — tranexamic acid injection 1,000 mg
1000 | Freq: Once | INTRAVENOUS | Status: AC
Start: 2015-02-23 — End: 2015-02-23
  Administered 2015-02-23: 23:00:00 via INTRAVENOUS

## 2015-02-23 MED ORDER — acetaminophen (TYLENOL) tablet 650 mg
325 | Freq: Once | ORAL | Status: AC
Start: 2015-02-23 — End: 2015-02-23
  Administered 2015-02-23: 19:00:00 325 mg via ORAL

## 2015-02-23 MED ORDER — sterile water (bottle) irrigation
Status: DC | PRN
Start: 2015-02-23 — End: 2015-02-23
  Administered 2015-02-23: 22:00:00

## 2015-02-23 MED ORDER — aspirin EC 325 MG EC tablet
325 | ORAL | Status: AC
Start: 2015-02-23 — End: ?

## 2015-02-23 MED ORDER — fentaNYL (PF) (SUBLIMAZE) 50 mcg/mL injection 25-50 mcg
50 | INTRAMUSCULAR | Status: DC | PRN
Start: 2015-02-23 — End: 2015-02-23

## 2015-02-23 MED ORDER — lactated ringers infusion
INTRAVENOUS | Status: DC
Start: 2015-02-23 — End: 2015-02-23

## 2015-02-23 MED ORDER — oxyCODONE (ROXICODONE) 5 MG immediate release tablet
5 | ORAL_TABLET | ORAL | 0.00 refills | 9.50000 days | Status: DC
Start: 2015-02-23 — End: 2019-05-01

## 2015-02-23 MED ORDER — promethazine (PHENERGAN) 25 MG tablet
25 | ORAL_TABLET | Freq: Four times a day (QID) | ORAL | Status: AC
Start: 2015-02-23 — End: 2015-03-08

## 2015-02-23 MED ORDER — polymyxin B 500,000 Units, bacitracin 50,000 Units in sodium chloride (NS) 1,000 mL irrigation
0.9 | Status: DC | PRN
Start: 2015-02-23 — End: 2015-02-23
  Administered 2015-02-23: 22:00:00

## 2015-02-23 MED FILL — TRANEXAMIC ACID 1,000 MG/10 ML (100 MG/ML) INTRAVENOUS SOLUTION: 1000 mg/10 mL (100 mg/mL) | INTRAVENOUS | Qty: 20

## 2015-02-23 MED FILL — OMEPRAZOLE 20 MG CAPSULE,DELAYED RELEASE: 20 mg | ORAL | Qty: 1

## 2015-02-23 MED FILL — SODIUM CHLORIDE 0.9 % INTRAVENOUS SOLUTION: INTRAVENOUS | Qty: 100

## 2015-02-23 MED FILL — CEFAZOLIN 2 GRAM/50 ML IN DEXTROSE (ISO-OSMOTIC) INTRAVENOUS PIGGYBACK: 2 gram/50 mL | INTRAVENOUS | Qty: 50

## 2015-02-23 MED FILL — MIDAZOLAM (PF) 1 MG/ML INJECTION SOLUTION: 1 mg/mL | INTRAMUSCULAR | Qty: 1

## 2015-02-23 MED FILL — VANCOMYCIN 1,000 MG INTRAVENOUS INJECTION: 1000 mg | INTRAVENOUS | Qty: 1000

## 2015-02-23 MED FILL — OXYCODONE 5 MG TABLET: 5 mg | ORAL | Qty: 1

## 2015-02-23 MED FILL — SODIUM CHLORIDE 0.9 % IVPB - MB+: INTRAVENOUS | Qty: 100

## 2015-02-23 MED FILL — NESACAINE-MPF 30 MG/ML (3 %) INJECTION SOLUTION: 30 mg/mL (3 %) | INTRAMUSCULAR | Qty: 20

## 2015-02-23 MED FILL — EXPAREL (PF) 1.3 % (13.3 MG/ML) SUSPENSION FOR LOCAL INFILTRATION: 1.3 % (13.3 mg/mL) | Qty: 20

## 2015-02-23 MED FILL — ONDANSETRON HCL (PF) 4 MG/2 ML INJECTION SOLUTION: 4 | INTRAMUSCULAR | Qty: 2

## 2015-02-23 MED FILL — TRANEXAMIC ACID 1,000 MG/10 ML (100 MG/ML) INTRAVENOUS SOLUTION: 1000 mg/10 mL (100 mg/mL) | INTRAVENOUS | Qty: 10

## 2015-02-23 MED FILL — ASPIRIN 325 MG TABLET,ENTERIC COATED: 325 mg | ORAL | Qty: 1

## 2015-02-23 MED FILL — DEXAMETHASONE SODIUM PHOSPHATE (PF) 10 MG/ML INJECTION SOLUTION: 10 mg/mL | INTRAMUSCULAR | Qty: 1

## 2015-02-23 MED FILL — FENTANYL (PF) 50 MCG/ML INJECTION SOLUTION: 50 ug/mL | INTRAMUSCULAR | Qty: 2

## 2015-02-23 MED FILL — COUMADIN 2.5 MG TABLET: 2.5 mg | ORAL | Qty: 1

## 2015-02-23 MED FILL — MELOXICAM 7.5 MG TABLET: 7.5 mg | ORAL | Qty: 2

## 2015-02-23 MED FILL — BUPIVACAINE (PF) 0.5 % (5 MG/ML) INJECTION VIAL: 0.5 % (5 mg/mL) | INTRAMUSCULAR | Qty: 30

## 2015-02-23 MED FILL — EPHEDRINE (PF) 50 MG/5 ML (10 MG/ML) IN 0.9% SOD. CHLORIDE IV SYRINGE: 50 mg/5 mL (10 mg/mL) | INTRAVENOUS | Qty: 5

## 2015-02-23 MED FILL — PROMETHAZINE 25 MG TABLET: 25 mg | ORAL | Qty: 1

## 2015-02-23 MED FILL — KETOROLAC 30 MG/ML (1 ML) INJECTION SOLUTION: 30 mg/mL | INTRAMUSCULAR | Qty: 1

## 2015-02-23 MED FILL — LACTATED RINGERS INTRAVENOUS SOLUTION: INTRAVENOUS | Qty: 1000

## 2015-02-23 MED FILL — ACETAMINOPHEN 325 MG TABLET: 325 mg | ORAL | Qty: 2

## 2015-02-23 MED FILL — SODIUM CHLORIDE 0.9 % IRRIGATION SOLUTION: 0.9 % irrigation | Qty: 1000

## 2015-02-23 NOTE — Anesthesia Pre-Procedure Evaluation (Addendum)
Anesthesia Evaluation      History of anesthetic complications   Airway   Mallampati: I  TM distance: > 8 cm  Dental    (+) age appropriate    Pulmonary - normal exam    breath sounds clear to auscultation  Cardiovascular - normal exam    ECG reviewed  Rhythm: regular  Rate: normal    Neuro/Psych    (+) headaches,     GI/Hepatic/Renal      Comments: Diverticulitis   History of colonic polyps   Hemorrhoids        Endo/Other  (+) , cancer    Comments: Problem List      Lung nodule   Migraine   Osteopenia   Diverticulosis   Hx of colonic polyps   Osteoarthritis   Malignant neoplasm of bronchus and lung, unspecified site   Malignant neoplasm middle lobe, bronchus or lung   Carcinoid bronchial adenoma of right lung   Snoring   Primary osteoarthritis of right knee       Musculoskeletal  (+) arthritis, Degenerative Joint Disease       Comments: Localized primary osteoarthritis of lower leg, right                 Anesthesia Plan    ASA 2     spinal   (FEM/SAPH)  Anesthetic Plan, Alternatives, Risks and Questions discussed with patient.

## 2015-02-23 NOTE — H&P (Signed)
@  date@  No Interval Change in health.  Hannah Willard Van Horne MD

## 2015-02-23 NOTE — Consults (Signed)
History and Physical      Name: Hannah Mann  DOB: 1950/11/03 64 y.o.  MRN: [id number]  CSN: [id number]    History:     Chief Complaint:  Localized primary osteoarthritis of lower leg, right [M17.11]    History of Present Illness:  Hannah Mann is a 64 y.o. female who presents for evaluation of medical conditions concomitant to TJR at request of Dr Lorina Rabon. Symptoms have been severe. The symptoms have occurred over the last several years, and occur all day. The symptoms are aggravated by any ADL and relieved by No conservative measures.  Therefore, patient elects to have Surgery with Dr Lorina Rabon to improve ADL and reduce Pain.     I met with patient in Preop #1 and reviewed patients medical history.      Patient has not had CAD, CKD, COPD, OSA, DM, HTN, Angina,     Patient has had similar surgery in the past.  without complications.    Patient is able to climb 2 flights of stairs without SOB, DOE or Angina.    Patient has been cleared by Mont Dutton, NP.    We reviewed Preventive measures and intentional use of medications.  Cautions post operatively reviewed.     Patient appears to be ASA 2 surgical risk.        Past Medical History:  Past Medical History   Diagnosis Date   . History of migraine headaches    . Lung nodule    . Osteopenia    . Diverticulitis    . History of colonic polyps    . Hemorrhoids    . Osteoarthritis    . Carcinoma of the skin, basal cell      left leg   . Chronic pain      knees   . PONV (postoperative nausea and vomiting)      Past Surgical History:  Past Surgical History   Procedure Laterality Date   . Appendectomy  1980   . Knee surgery Right 1996     arthroscopy   . Cholecystectomy  2002   . Procedure Right 08/06/2013     Procedure: THORACOSCOPY; mediastinal lymph node disection; MIDDLE LOBECTOMY;  Surgeon: Johnsie Kindred, MD;  Location: [facility] OR;  Service: Cardiology;  Laterality: Right;   . Hysterectomy     . Colonoscopy       Current Medications:  Prior to Admission  Medications    Medication Dose & Frequency   acetaminophen (TYLENOL 8 HOUR) 650 MG CR tablet Acetaminophen (Tylenol) 325 mg 2 tabs or Extended Release Acetaminophen 650 mg 1 tab. Take Breakfast, Lunch, Dinner and bed time for 2-3 months. Stop when you can sleep thru the night without pain from your new joint. Reduces need for narcotics   aspirin 325 MG tablet Take 1 tablet (325 mg total) by mouth daily with lunch for 42 days. Taking aspirin helps to Prevent Blood Clots.  Take for 6 weeks with Lunch.  See Dr Zenaida Niece [facility] After Surgery Med Instructions   cephalexin (KEFLEX) 500 MG capsule Take 1 capsule (500 mg total) by mouth 4 (four) times daily for 7 days.   docusate sodium (COLACE) 100 MG capsule Take 1 capsule (100 mg total) by mouth 2 (two) times daily. Take while on narcotic medications to prevent constipation.  If loose stools decrease to once a day.   meloxicam (MOBIC) 15 MG tablet Take 1 tablet (15 mg total) by mouth daily with dinner for 90 days. See Dr  Zenaida Niece [facility] After Surgery Med Directions.   omeprazole (PRILOSEC) 20 MG capsule Take 1 capsule (20 mg total) by mouth daily for 90 days. Take Before breakfast  Take for 90 days to prevent ulcers.   oxyCODONE (ROXICODONE) 5 MG immediate release tablet Take 1/2 tab by mouth after Breakfast, Lunch, Dinner and Bed Time Snack.  If necessary you can take an additional 1/2 to 2 tabs every 4 hrs for pain as needed  See Dr Zenaida Niece [facility] After Surgery Meds Directions   polyethylene glycol (MIRALAX) 17 gram packet Take 1 packet (17 g total) by mouth 2 (two) times daily. Take while using narcotic pain medications to prevent constipation.  If loose stools decrease to once a day.   promethazine (PHENERGAN) 25 MG tablet Take 1 tablet (25 mg total) by mouth 4 (four) times daily with meals and nightly for 14 days. Take 15 min before narcotic to prevent nausea.  Take additional 1/2-2 tab(s) every 6 hrs as needed for nausea.  See Dr Zenaida Niece [facility] After Surgery Med  Directions.        Allergies:  Review of patient's allergies indicates no known allergies.    Family History:  Family History   Problem Relation Age of Onset   . Cancer Mother    . Diabetes Mother    . Cancer Father    . Tobacco Dependence Father      Social History:  History   Substance Use Topics   . Smoking status: Passive Smoke Exposure - Never Smoker   . Smokeless tobacco: Never Used   . Alcohol Use: Yes      Comment: occasional     Immunization:    There is no immunization history on file for this patient.  Review of Systems:  A comprehensive review of systems was negative.    Physical Exam:     Vital Signs:  Initial Vitals   BP 02/23/15 1151 122/66 mmHg   Heart Rate 02/23/15 1151 63   Resp 02/23/15 1151 18   Temp 02/23/15 1151 36.3 ?C (97.3 ?F)   SpO2 02/23/15 1151 98 %     Exam:  General: alert, no distress  Head: Normocephalic, atraumatic  Eyes: EOMI, non injected, no discharge  Oropharynx: MMM, no lesions  Neck/Nodes: Supple, no lymphadenopathy  Chest: CTA bilaterally, no GFR  Cardiac: RRR, no murmur  Abdomen: Soft, NT, ND, no HSM, no masses  Neurologic: Grossly non focal    Data:     Labs:  No results found for this visit on 02/23/15 (from the past 24 hour(s)).       Imaging for last 24 hours:  No results found.    Special Studies:  none    Assessment:     Diagnoses:  Active Hospital Problems    Diagnosis SNOMED CT(R) Date Noted   . Primary osteoarthritis of right knee OSTEOARTHRITIS OF KNEE 02/22/2015   . Carcinoid bronchial adenoma of right lung CARCINOID BRONCHIAL ADENOMA 07/22/2014   . Snoring SNORING 07/22/2014   . Malignant neoplasm middle lobe, bronchus or lung MALIGNANT NEOPLASM OF MIDDLE LOBE, BRONCHUS OR LUNG 08/06/2013       Plan:   TJR orders are anticipated.  Discussed and gave information for Preventive Measures.  Discussed and gave information regarding Intentional Use of Medications.   Advised to use PNV and Fe after discharge.  Advised to see Mont Dutton NP in 2 weeks to review  surgery and hospitalization.  Nursing will call regarding any concerns or complications during  stay in hospital.    Adjustments to medical program include none.    Fmaily present during instruction    Denita Lung  02/23/2015  9:32 PM

## 2015-02-23 NOTE — OR Nursing (Signed)
Family at bedside.  Pt. Eating [place] sandwich and drinking water.

## 2015-02-23 NOTE — Anesthesia Procedure Notes (Addendum)
FEMORAL/SAPH/ULTRASOUND/Peripheral Block  Patient location during procedure: pre-op/SSU  Start time: 02/23/2015 1:25 PM  End time: 02/23/2015 1:30 PM  Reason for block: post-op pain management  Staffing  Resident/CRNA: Jericho Alcorn  Other anesthesia staff: Christian Mate  Performed by: CRNA   Preanesthetic Checklist  Completed: patient identified, site marked, surgical consent, pre-op evaluation, timeout performed, IV checked, risks and benefits discussed, monitors and equipment checked and post-op pain management  Peripheral Block  Patient position: supine  Prep: ChloraPrep, site prepped and draped and MASK GLOVES  Patient monitoring: heart rate, EKG, continuous pulse ox and frequent blood pressure checks  Block type: femoral and adductor canal  Laterality: right  Injection technique: single-shot  Procedures: ultrasound guided and nerve stimulator  Ultrasound Description: SEE PIC  Local infiltration: ropicivaine and ropivacaine  Infiltration strength: 0.1 %  Dose: 20 mL  Needle  Needle type: short-bevel   Needle gauge: 21 G  Needle length: 4 in  Assessment  Injection assessment: negative aspiration for heme, no paresthesia on injection, incremental injection and local visualized surrounding nerve on ultrasound  Paresthesia pain: none  Heart rate change: no  Slow fractionated injection: no    Spinal Block  Patient location during procedure: OR  Start time: 02/23/2015 2:01 PM  End time: 02/23/2015 2:06 PM  Staffing  Other anesthesia staff: Christian Mate  Performed by: CRNA   Preanesthetic Checklist  Completed: patient identified, site marked, surgical consent, pre-op evaluation, timeout performed, IV checked, risks and benefits discussed and monitors and equipment checked  Spinal Block  Patient position: sitting  Prep: ChloraPrep, site prepped and draped and MASK GLOVES  Patient monitoring: heart rate, continuous pulse ox, cardiac monitor and frequent blood pressure checks  Approach: midline  Location: L3-4  Injection  technique: single-shot  Needle  Needle type: pencan.   Needle gauge: 25 G  Needle length: 3.5 in  Needle insertion depth: 5 cm  Additional Notes  X1 ATTEMPT W/O REDIRECTS

## 2015-02-23 NOTE — Other (Signed)
Physical Therapy Evaluation    Patient name:  Hannah Mann  Date:  02/23/2015    ASSESSMENT    Patient is safe and independent with bed mobility, transfers with FWW, community distance gait with FWW and stairs per home environment.    Discharge Recommendations: home with OPPT    PATIENT INFORMATION    Primary Diagnosis:    Patient Active Problem List   Diagnosis SNOMED CT(R)   . Lung nodule LUNG MASS   . Migraine MIGRAINE   . Osteopenia OSTEOPENIA   . Diverticulosis DIVERTICULAR DISEASE   . Hx of colonic polyps HISTORY OF POLYP OF COLON   . Osteoarthritis OSTEOARTHRITIS   . Malignant neoplasm of bronchus and lung, unspecified site MALIGNANT TUMOR OF LUNG   . Malignant neoplasm middle lobe, bronchus or lung MALIGNANT NEOPLASM OF MIDDLE LOBE, BRONCHUS OR LUNG   . 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 Snoring SNORING   . Primary osteoarthritis of right knee OSTEOARTHRITIS OF KNEE     Surgery:  Procedure(s) (LRB):  R-TOTAL KNEE ARTHROPLASTY (Right)    Days Post surgery:  Day of Surgery    Pertinent Medical/Surgical Hx:    Past Medical History   Diagnosis Date   . History of migraine headaches    . Lung nodule    . Osteopenia    . Diverticulitis    . History of colonic polyps    . Hemorrhoids    . Osteoarthritis    . Carcinoma of the skin, basal cell      left leg   . Chronic pain      knees   . PONV (postoperative nausea and vomiting)      Past Surgical History   Procedure Laterality Date   . Appendectomy  1980   . Knee surgery Right 1996     arthroscopy   . Cholecystectomy  2002   . Procedure Right 08/06/2013     Procedure: THORACOSCOPY; mediastinal lymph node disection; MIDDLE LOBECTOMY;  Surgeon: Johnsie Kindred, MD;  Location: [facility] OR;  Service: Cardiology;  Laterality: Right;   . Hysterectomy     . Colonoscopy       Precautions:  Knee immobilizer first 24 hours, WBAT RLE     Hx of Onset:  R TKA 02/23/15    PLOF:  Active and independent in home and community without AD    Social  Situation:  Lives with husband in [place], [place]    Home Accessibility/ Stairs:   Single stair to enter 4 inches x2    Equipment:  FWW, SPC    SUBJECTIVE    Eager to mobilize.    Pain: Pain does not limit patient's functional ability at present.     OBJECTIVE    Appliances:  Knee immobilizer    Observation:  Alert and spry    Mental Status/LOC/Orientation:  A&Ox4    Sensation:  Functional   Strength:  Functional; able to SLR, heel raise, LAQ x10  ROM:  Functional     Balance:  Sit Static: n  Sit Dynamic: n  Stand Static: f  Stand Dynamic: f    Bed Mobility: Safe and independent      Transfers:  Safe and independent with FWW    Gait:  Jim Desanctis walked 350 feet with FWW and no assist.    Specific Gait Issues/Limitations:  None apparent    Stairs: safe and independent per home environment with FWW    Endurance:  good    Vitals: No significant changes in vital signs noted.     Today's Treatment:    Evaluation Completed and Goals addressed.  Patient left in room in wheelchair extensions stretch with call light.  Phone left within reach.  Exit alarm on.  Instructed in benefits of increased out of bed time.  Encouraged patient to be out of bed for 2 hours.    PLAN    D/C today.    Patient/Family Goal:  D/C        Therapist/License #: Terance Ice, PT    Start Time: 1700 /Stop Time: 1800

## 2015-02-23 NOTE — Brief Op Note (Signed)
Brief Operative Note    Procedure(s) (LRB):  R-TOTAL KNEE ARTHROPLASTY (Right)     Preoperative Diagnoses:   Localized primary osteoarthritis of lower leg, right [M17.11]    Postoperative Diagnoses:  Post-Op Diagnosis Codes:     * Localized primary osteoarthritis of lower leg, right [M17.11]     Surgeons: Surgeon(s) and Role:     * Tyna Jaksch, MD - Primary    Assistant(s): Physician Assistant: Marye Round, PA     Anesthesia Provider: CRNA: Lucina Mellow, CRNA  Anesthesia SRNA: Christian Mate    Anesthesia Type: Spinal    Height & Weight:157.5 cm (62) & 59.421 kg (131 lb)     Specimens:   ID Type Source Tests Collected by Time Destination   A : right knee  Tissue/Biopsy Bone (specify site) PATHOLOGY/CYTOLOGY TESTING Tyna Jaksch, MD 02/23/2015 1435               Implants:  Implant Name Type Inv. Item Serial No. Manufacturer Lot No. LRB No. Used Action   CEMENT BONE GENTAMYCIN 20GR - [id number]  CEMENT BONE GENTAMYCIN 20GR  DEPUY [id number] Right 3 Implanted   PATELLA 3PEG ROUND SIGMA - [id number] Patella PATELLA 3PEG ROUND SIGMA  DEPUY ORTHOPAEDICS I [id number] Right 1 Implanted   INSERT TIB STB GVF SZ2.0 10.0MM SIGMA - [id number]  INSERT TIB STB GVF SZ2.0 10.0MM SIGMA  DEPUY ORTHOPAEDICS I [id number] Right 1 Implanted   TRAY TIB CEM MOD COCR SZ2.0 SIGMA - [id number]  TRAY TIB CEM MOD COCR SZ2.0 SIGMA  [employer] [id number] Right 1 Implanted   COMPONENT FEM CEM PS SZ2.0 RT SIGMA - [id number]   COMPONENT FEM CEM PS SZ2.0 RT SIGMA   DEPUY ORTHOPAEDICS I [id number] Right 1 Implanted       Antibiotics (admin):   Recent Abx Admin                   vancomycin (VANCOCIN) injection (mg) 1,000 mg Given 02/23/15 1437    polymyxin B 500,000 Units, bacitracin 50,000 Units in sodium chloride (NS) 1,000 mL irrigation ()  Given 02/23/15 1437                Incision Time:   Anes Incision Time     Date Time Event    No anesthesia events filed.           Timeout Completed:     Estimated Blood Loss: 250    Urethral  Catheter:           Findings: djd    Complications: none     Disposition: PACU - hemodynamically stable.    Ladonna Snide

## 2015-02-23 NOTE — Discharge Planning (AHS/AVS) (Signed)
DISCHARGE PLANNING ASSESSMENT      Plan: Family House tonight then home tomorrow      PATIENT HISTORY     Primary Diagnosis: Localized primary osteoarthritis of lower leg, right [M17.11]          Support Systems/Services (Current)  Living Arrangements: Spouse/significant other  Type of Residence: Private residence  Support Systems: Spouse/significant other           Resource Management Assessment  Referral Source: Physician  Referral Reason: Risk Factor Assessment    ASSESSMENT       General Observations: A/O patient lying in bed with spouse in room    Prior Level of Function:  Prior Level of Function  Potential Risk Factors: Change in care needs, Severity of injury/illness  Home Environment: Single level (2 entry steps)  Mentation: Oriented  Who assists with ADLs?: independent  Mobility: Ambulatory, Independent     Complicating Factors/Barriers:none    Patient/Family Goal:  Home with spouse  Met with patient regarding discharge planning needs.   Patient has assistance at home.  Patient has necessary DME: front wheel walker, raised toilet seat, shower bench, cane  Patient has a confirmed appointment with Outpatient PT:   Patient has an order for DVT prohylxis: ASA 325 mg  X 42 days      Discharge Planning :Tamera Punt, RN    Tamera Punt, RN Case Manager 02/23/2015 4:49 PM

## 2015-02-23 NOTE — Op Note (Signed)
DATE OF PROCEDURE:  02/23/2015    PREOPERATIVE DIAGNOSIS:  Degenerative joint disease, right knee, grade 4 medial   with tricompartmental degenerative changes.    POSTOPERATIVE DIAGNOSIS:  Degenerative joint disease, right knee, grade 4 medial  with tricompartmental degenerative changes.    INDICATIONS:  This is a 64 year old female with progressive degenerative   arthritis, failed conservative care has progressive pain and limitations in her   life ADLs, walking lifestyle after conservative care.  Conservative care is   documented, it is on the chart.  She has pain limiting her life for greater than  6 months.  X-rays within 6 months.  She has DJD of the left knee,   tricompartmental.  No medical problems of significance.  BMI is 24.  GFR is 82,   albumin is 43.  H and H is 13.3 and 40.2.  Extended PARQ has been held.  It is   documented, it is on the chart.    PROCEDURE:  Total knee replacement, right, using a DePuy Johnson and [employer] PFC Sigma total knee replacement, femoral component size 2, right   posterior stabilized chrome cobalt tibial tray, cemented modular chrome cobalt   size 2 tibial insert, size 2 10 mm thick posterior stabilized patella, 3-peg   round dome 32 mm, and DePuy gentamicin cement.    SURGEON:  Orpah Clinton, MD    ASSISTANT:  Hessie Diener, PA-C    ESTIMATED BLOOD LOSS:  250 mL.    DRAINS:  None.    FOLEY:  None.    COMPLICATIONS:  None.    ANTIBIOTICS:  Ancef 2 grams.    HEMOSTATIC AGENT:  Tranexamic acid 2 grams.    PROCEDURE NOTE AS FOLLOWS:  The patient was taken to the operating room, placed   under a spinal anesthetic.  Prepped and draped using sterile technique.  Direct   anterior approach placing the incision just medial to the patella.  A mid vastus  approach.  Tibial cutting guide placed 2 mm off the low medial side, 2 off the   subchondral bone laterally sized at a 2.  Intramedullary guide placed in the   femur, 5 degrees, 10 mm was resected, sized to a 2.   Ranawat blocks used to   determine rotation and position.  All cuts were made.  Trialed a 2 femur, 2   tibia, 8 then a 10 mm poly gave [place] full extension, 120 degrees flexion.  No   medial instability.  No anterior drawer.  No lift off.  No Lachman.  Centrally   tracking patella.  We punched and drilled the tibia, cut and drilled the   patella, irrigated copiously, infiltrated the knee with local anesthetic and   Exparel, dried the knee, cemented in the above-stated components, brought the   knee into full extension.  Dilute iodine solution was placed into the joint.    Once the cement was hard and hot, we irrigated and obtained hemostasis, trialed   the knee again, removed additional cement and found stability as noted above,   centrally tracking patella.  Irrigated with all remaining fluids, dried the   knee, placed in a gram of vancomycin powder, closed the knee per standard   protocols.  Dermabond Prineo on the skin, Aquacel dressing, taken to the   recovery room in stable condition.    ASSESSMENT:  This patient is status post uncomplicated total knee replacement,   right. who also has DJD of the left knee  and will probably need this in the near  future.    PLAN:  Proceed forward with my standard rehab, mobilize, ambulate.  Plan for   discharge to home today if she is safe for that environment.  Continue   outpatient physical therapy at [facility] per my protocols.  VTE  prophylaxis, sequential calf squeezers, knee-high TEDs, Coumadin and aspirin.    Pain control with Tylenol, meloxicam 15 mg a day, Omeprazole, Phenergan 25 mg   half tab before meals and at bedtime with oxycodone 5 mg half tab.  Keflex for a  week.  MiraLax, Colace.  She will follow up in the office at 4 weeks and at 8   weeks.  In 4 weeks, she will need a PA standing knees, merchant lateral, at 8   weeks knee evaluation sheet.  If she wishes to proceed forward with the opposite  knee in approximately 3 months, at the 4-week point  we should start her on a   prenatal vitamin, extra dose of protein.  She can do it as close as 2 months   from this knee if she wishes to do so.    Colin Broach, MD    JRV/nm  D:  02/23/2015  4:41 P   T:  02/23/2015  11:53 P   TID:  [id number]  RECEIPT:    [id number]  cc:  Darene Lamer, MD  Denita Lung, MD

## 2015-02-24 NOTE — Discharge Planning (AHS/AVS) (Signed)
LATE ENTRY: Patient is safe for discharge home per physical therapy. Patient has assistance at home, necessary DME and prescriptions, a prescribed DVT prophylaxis and a confirmed appointment with orthopedics and outpatient physical therapy.       02/24/15 0800   Discharge Disposition   Discharge Disposition Home   Outpatient Services   Type Rehab   Transportation Arrangements   Scheduled Transportation Date 02/23/15   Tamera Punt, RN Case Manager 02/24/2015 8:20 AM

## 2015-02-24 NOTE — Anesthesia Post-Procedure Evaluation (Signed)
Patient Name: Hannah Mann  Procedures performed: Procedure(s):  R-TOTAL KNEE ARTHROPLASTY    Last Vitals:   Filed Vitals:    02/23/15 1707   BP: 116/56   Pulse: 80   Temp: 36.5 ?C   Resp: 20   SpO2: 99%       Planned Anesthesia Type: spinal  Final Anesthesia Type: spinal  Patients Current Location: PACU  Level of Consciousness: awake, alert and responds appropriatly  Post Procedure Pain:adequate analgesia  Airway: Patent  Respiratory Status: room air  Cardio Status: hemodynamically stable  Hydration: addequetly hydrated  PONV? NO  Anesthetic Complications? No    The patient was able to participate in the post op evaluation, but has not recovered from regional anesthetic and is expected to recover within 48 hours    Comments:      Lucina Mellow  9:53 AM

## 2017-12-19 ENCOUNTER — Inpatient Hospital Stay: Admit: 2017-12-19 | Payer: MEDICARE

## 2017-12-19 DIAGNOSIS — C7A090 Malignant carcinoid tumor of the bronchus and lung: Secondary | ICD-10-CM

## 2017-12-19 MED ORDER — PENTETREOTIDE (INDIUM-111) 6.6 millicurie
Freq: Once | Status: AC
Start: 2017-12-19 — End: 2017-12-19
  Administered 2017-12-19: 16:00:00 via INTRAVENOUS

## 2017-12-19 MED FILL — PENTETREOTIDE (INDIUM-111): 6.6000 | Qty: 6.6

## 2017-12-20 ENCOUNTER — Inpatient Hospital Stay: Admit: 2017-12-20 | Payer: MEDICARE

## 2017-12-21 ENCOUNTER — Inpatient Hospital Stay: Admit: 2017-12-21 | Payer: MEDICARE

## 2018-11-21 ENCOUNTER — Inpatient Hospital Stay: Admit: 2018-11-21 | Payer: MEDICARE | Attending: PA

## 2018-11-21 DIAGNOSIS — Z9889 Other specified postprocedural states: Secondary | ICD-10-CM

## 2019-05-02 MED ORDER — oxyCODONE (ROXICODONE) 5 MG immediate release tablet
5 | ORAL_TABLET | ORAL | 0 refills | 9.50000 days | Status: DC
Start: 2019-05-02 — End: 2020-06-12

## 2019-05-02 MED ORDER — polyethylene glycol (MIRALAX) 17 gram packet
17 | Freq: Two times a day (BID) | ORAL | 0 refills | 15.00000 days | Status: AC
Start: 2019-05-02 — End: ?

## 2019-05-02 MED ORDER — acetaminophen ER (TYLENOL) 650 MG CR tablet
650 | Freq: Three times a day (TID) | ORAL | Status: DC
Start: 2019-05-02 — End: 2020-06-12

## 2019-05-02 MED ORDER — meloxicam (MOBIC) 15 MG tablet
15 | ORAL_TABLET | Freq: Every day | ORAL | 2 refills | Status: AC
Start: 2019-05-02 — End: 2019-07-30

## 2019-05-02 MED ORDER — gabapentin (NEURONTIN) 300 MG capsule
300 | ORAL_CAPSULE | Freq: Every evening | ORAL | 2 refills | Status: DC
Start: 2019-05-02 — End: 2020-06-12

## 2019-05-02 MED ORDER — cefadroxil (DURICEF) 1 gram tablet
1 | ORAL_TABLET | Freq: Two times a day (BID) | ORAL | 0 refills | Status: AC
Start: 2019-05-02 — End: 2019-05-08

## 2019-05-02 MED ORDER — omeprazole magnesium (PRILOSEC OTC) 20 MG tablet
20 | Freq: Every morning | ORAL | 1.00 refills | 90.00000 days | Status: DC
Start: 2019-05-02 — End: 2020-06-12

## 2019-05-02 MED ORDER — docusate sodium (COLACE) 100 MG capsule
100 | ORAL_CAPSULE | Freq: Two times a day (BID) | ORAL | 0 refills | 30.00000 days | Status: DC
Start: 2019-05-02 — End: 2020-06-12

## 2019-05-02 MED ORDER — aspirin 325 MG tablet
325 | ORAL_TABLET | Freq: Every day | ORAL | 0 refills | 30.00000 days | Status: DC
Start: 2019-05-02 — End: 2020-06-12

## 2019-05-02 MED ORDER — ascorbic acid, vitamin C, (VITAMIN C) 500 MG tablet
500 | ORAL_TABLET | Freq: Two times a day (BID) | ORAL | 1 refills | 90.00000 days | Status: DC
Start: 2019-05-02 — End: 2020-06-12

## 2019-05-02 MED ORDER — promethazine (PHENERGAN) 25 MG tablet
25 | ORAL_TABLET | Freq: Four times a day (QID) | ORAL | 1 refills | Status: AC
Start: 2019-05-02 — End: 2019-05-15

## 2019-05-02 NOTE — Procedures (Signed)
H&P dated 04/26/19, PARQ included, in chart review, notes.  Consent completed by patient, signed by MD, in chart review, scans/media.  Orders in place.    Pt denies chest pain, difficulty with a flight of stairs, and orthopnea.    Pt denies cough, fever, respiratory symptoms.  Also denies recent travels, exposure to persons with COVID-19.    Clearance from [facility] Dr. Nicanor Alcon and Hematologist Dr. Valera Castle in chart review, scans/media.      COVID screening test to be obtained on Saturday 05/04/19 @ [facility] in [place].

## 2019-05-04 ENCOUNTER — Other Ambulatory Visit: Admit: 2019-05-04 | Discharge: 2019-05-04 | Payer: MEDICARE

## 2019-05-04 DIAGNOSIS — R5383 Other fatigue: Secondary | ICD-10-CM

## 2019-05-04 LAB — SARS-COV-2 (COVID-19), SCREENING/ASYMPTOMATIC UNEXPOSED: SARS-CoV-2 (COVID-19) by RT-PCR: NOT DETECTED

## 2019-05-06 ENCOUNTER — Ambulatory Visit: Admit: 2019-05-06 | Payer: MEDICARE

## 2019-05-06 LAB — TISSUE EXAM

## 2019-05-06 MED ORDER — pantoprazole (PROTONIX) delayed release (EC) tablet 40 mg
40 | Freq: Once | ORAL | Status: AC
Start: 2019-05-06 — End: 2019-05-06
  Administered 2019-05-06: 19:00:00 40 mg via ORAL

## 2019-05-06 MED ORDER — Van Horne's Mixture - EPINEPHrine 1:1000-1.1 mg, bupivacaine PF (MARCAINE) 0.5%-30 mL, ketorolac (TORADOL) 15 mg
15 | Freq: Once | INTRAMUSCULAR | Status: AC
Start: 2019-05-06 — End: 2019-05-06
  Administered 2019-05-06: 21:00:00 via INTRAMUSCULAR

## 2019-05-06 MED ORDER — oxyCODONE (ROXICODONE) tablet 2.5 mg
5 | ORAL | Status: AC | PRN
Start: 2019-05-06 — End: 2019-05-06
  Administered 2019-05-06: 19:00:00 5 mg via ORAL

## 2019-05-06 MED ORDER — promethazine (PHENERGAN) tablet 25 mg
25 | ORAL | Status: AC | PRN
Start: 2019-05-06 — End: 2019-05-06
  Administered 2019-05-06: 19:00:00 25 mg via ORAL

## 2019-05-06 MED ORDER — propofoL (DIPRIVAN) injection
10 | INTRAVENOUS | Status: DC | PRN
Start: 2019-05-06 — End: 2019-05-06
  Administered 2019-05-06 (×3): 10 mg/mL via INTRAVENOUS

## 2019-05-06 MED ORDER — lactated Ringers infusion
INTRAVENOUS | Status: DC
Start: 2019-05-06 — End: 2019-05-06
  Administered 2019-05-06: 19:00:00 via INTRAVENOUS

## 2019-05-06 MED ORDER — sterile water (bottle) irrigation
Status: DC | PRN
Start: 2019-05-06 — End: 2019-05-06
  Administered 2019-05-06: 21:00:00

## 2019-05-06 MED ORDER — vancomycin (VANCOCIN) 1,000 mg injection
1000 | INTRAVENOUS | Status: AC
Start: 2019-05-06 — End: ?

## 2019-05-06 MED ORDER — sodium chloride 0.9% (NS PF) injection
INTRAMUSCULAR | Status: DC | PRN
Start: 2019-05-06 — End: 2019-05-06
  Administered 2019-05-06: 21:00:00 via SUBCUTANEOUS

## 2019-05-06 MED ORDER — dexAMETHasone sodium phos (PF) (DECADRON) injection 10 mg
10 | INTRAMUSCULAR | Status: AC | PRN
Start: 2019-05-06 — End: 2019-05-06
  Administered 2019-05-06: 19:00:00 10 mg via INTRAVENOUS

## 2019-05-06 MED ORDER — oxyCODONE (ROXICODONE) tablet 2.5-5 mg
5 | ORAL | Status: DC | PRN
Start: 2019-05-06 — End: 2019-05-06

## 2019-05-06 MED ORDER — sodium chloride (NS) 0.9% irrigation
0.9 | Status: DC | PRN
Start: 2019-05-06 — End: 2019-05-06
  Administered 2019-05-06 (×3): 0.9 % irrigation

## 2019-05-06 MED ORDER — fentaNYL (PF) (SUBLIMAZE) injection
50 | INTRAMUSCULAR | Status: DC | PRN
Start: 2019-05-06 — End: 2019-05-06
  Administered 2019-05-06: 21:00:00 50 mcg/mL via INTRATHECAL

## 2019-05-06 MED ORDER — sodium chloride 0.9% (NS PF) injection
INTRAMUSCULAR | Status: AC
Start: 2019-05-06 — End: ?

## 2019-05-06 MED ORDER — ceFAZolin (ANCEF) 2 g in D5W IVPB Premix
2 | Freq: Once | INTRAVENOUS | Status: AC
Start: 2019-05-06 — End: 2019-05-06
  Administered 2019-05-06: 21:00:00 2 g via INTRAVENOUS

## 2019-05-06 MED ORDER — bupivacaine liposomal (PF) (EXPAREL) 1.3 % (13.3 mg/mL) injection
1.3 | Status: DC | PRN
Start: 2019-05-06 — End: 2019-05-06
  Administered 2019-05-06: 21:00:00 1.3 % (13.3 mg/mL)

## 2019-05-06 MED ORDER — ketamine (KETALAR) 10 mg/mL syringe
10 | INTRAVENOUS | Status: AC
Start: 2019-05-06 — End: ?

## 2019-05-06 MED ORDER — ketorolac (TORADOL) 15 mg/mL injection 15 mg
15 | Freq: Once | INTRAMUSCULAR | Status: AC
Start: 2019-05-06 — End: 2019-05-06
  Administered 2019-05-06: 19:00:00 15 mg via INTRAVENOUS

## 2019-05-06 MED ORDER — tranexamic acid 1,000 mg/10 mL (100 mg/mL) injection
1000 | INTRAVENOUS | Status: AC
Start: 2019-05-06 — End: 2019-05-06
  Administered 2019-05-06: 23:00:00

## 2019-05-06 MED ORDER — ketorolac (TORADOL) 15 mg/mL injection 15 mg
15 | Freq: Four times a day (QID) | INTRAMUSCULAR | Status: DC | PRN
Start: 2019-05-06 — End: 2019-05-06

## 2019-05-06 MED ORDER — bupivacaine liposomal (PF) (EXPAREL) 1.3 % (13.3 mg/mL) injection
1.3 | Status: AC
Start: 2019-05-06 — End: ?

## 2019-05-06 MED ORDER — tranexamic acid 1,000 mg in sodium chloride 0.9 % (NS) 100 mL IVPB
1000 | Freq: Once | INTRAVENOUS | Status: AC
Start: 2019-05-06 — End: 2019-05-06

## 2019-05-06 MED ORDER — magnesium sulfate 2 g in Water IVPB Premix
2 | Freq: Once | INTRAVENOUS | Status: AC
Start: 2019-05-06 — End: 2019-05-06
  Administered 2019-05-06: 21:00:00 2 g via INTRAVENOUS

## 2019-05-06 MED ORDER — ondansetron (ZOFRAN) injection 4 mg
4 | Freq: Four times a day (QID) | INTRAMUSCULAR | Status: DC | PRN
Start: 2019-05-06 — End: 2019-05-06

## 2019-05-06 MED ORDER — meloxicam (MOBIC) tablet 15 mg
7.5 | ORAL | Status: DC | PRN
Start: 2019-05-06 — End: 2019-05-06
  Administered 2019-05-06: 19:00:00 7.5 mg via ORAL

## 2019-05-06 MED ORDER — fentaNYL (PF) (SUBLIMAZE) injection 50-100 mcg
50 | INTRAMUSCULAR | Status: DC | PRN
Start: 2019-05-06 — End: 2019-05-06

## 2019-05-06 MED ORDER — oxyCODONE (ROXICODONE) 5 MG tablet
5 | ORAL | Status: AC
Start: 2019-05-06 — End: 2019-05-06
  Administered 2019-05-06: 23:00:00 5 mg via ORAL

## 2019-05-06 MED ORDER — fentaNYL (PF) (SUBLIMAZE) injection 25-50 mcg
50 | INTRAMUSCULAR | Status: DC | PRN
Start: 2019-05-06 — End: 2019-05-06

## 2019-05-06 MED ORDER — acetaminophen (TYLENOL) tablet 1,300 mg
325 | ORAL | Status: DC | PRN
Start: 2019-05-06 — End: 2019-05-06
  Administered 2019-05-06: 19:00:00 325 mg via ORAL

## 2019-05-06 MED ORDER — aspirin tablet 325 mg
325 | ORAL | Status: DC | PRN
Start: 2019-05-06 — End: 2019-05-06
  Administered 2019-05-06: 19:00:00 325 mg via ORAL

## 2019-05-06 MED ORDER — vancomycin (VANCOCIN) injection
1000 | INTRAVENOUS | Status: DC | PRN
Start: 2019-05-06 — End: 2019-05-06
  Administered 2019-05-06: 21:00:00

## 2019-05-06 MED ORDER — oxyCODONE (ROXICODONE) tablet 2.5 mg
5 | Freq: Four times a day (QID) | ORAL | Status: DC
Start: 2019-05-06 — End: 2019-05-06

## 2019-05-06 MED ORDER — sodium chloride 0.9 % (NS) infusion
INTRAVENOUS | Status: AC
Start: 2019-05-06 — End: 2019-05-06
  Administered 2019-05-06 (×2)

## 2019-05-06 MED ORDER — ascorbic acid (vitamin C) (VITAMIN C) tablet 1,000 mg
500 | Freq: Two times a day (BID) | ORAL | Status: DC
Start: 2019-05-06 — End: 2019-05-06

## 2019-05-06 MED ORDER — ketamine (KETALAR) injection 15 mg
50 | Freq: Once | INTRAMUSCULAR | Status: AC
Start: 2019-05-06 — End: 2019-05-06
  Administered 2019-05-06: 21:00:00 50 mg via INTRAVENOUS

## 2019-05-06 MED ORDER — fentaNYL (PF) (SUBLIMAZE) 50 mcg/mL injection
50 | INTRAMUSCULAR | Status: AC
Start: 2019-05-06 — End: ?

## 2019-05-06 MED ORDER — tranexamic acid injection 2,000 mg
1000 | INTRAVENOUS | Status: AC | PRN
Start: 2019-05-06 — End: 2019-05-06
  Administered 2019-05-06: 21:00:00 via INTRAVENOUS

## 2019-05-06 MED FILL — OXYCODONE 5 MG TABLET: 5 mg | ORAL | Qty: 1

## 2019-05-06 MED FILL — ACETAMINOPHEN 325 MG TABLET: 325 mg | ORAL | Qty: 4

## 2019-05-06 MED FILL — FENTANYL (PF) 50 MCG/ML INJECTION SOLUTION: 50 ug/mL | INTRAMUSCULAR | Qty: 2

## 2019-05-06 MED FILL — EXPAREL (PF) 1.3 % (13.3 MG/ML) SUSPENSION FOR LOCAL INFILTRATION: 1.3 % (13.3 mg/mL) | Qty: 20

## 2019-05-06 MED FILL — SODIUM CHLORIDE 0.9 % INTRAVENOUS SOLUTION: INTRAVENOUS | Qty: 100

## 2019-05-06 MED FILL — SODIUM CHLORIDE 0.9 % INJECTION SOLUTION: INTRAMUSCULAR | Qty: 20

## 2019-05-06 MED FILL — CYKLOKAPRON 1,000 MG/10 ML (100 MG/ML) INTRAVENOUS SOLUTION: 1000 mg/10 mL (100 mg/mL) | INTRAVENOUS | Qty: 20

## 2019-05-06 MED FILL — BUPIVACAINE (PF) 0.5 % (5 MG/ML) INJECTION VIAL: 0.5 % (5 mg/mL) | INTRAMUSCULAR | Qty: 30

## 2019-05-06 MED FILL — VANCOMYCIN 1,000 MG INTRAVENOUS INJECTION: 1000 mg | INTRAVENOUS | Qty: 1000

## 2019-05-06 MED FILL — DEXAMETHASONE SODIUM PHOSPHATE (PF) 10 MG/ML INJECTION SOLUTION: 10 mg/mL | INTRAMUSCULAR | Qty: 1

## 2019-05-06 MED FILL — CEFAZOLIN 2 GRAM/50 ML IN DEXTROSE (ISO-OSMOTIC) INTRAVENOUS PIGGYBACK: 2 gram/50 mL | INTRAVENOUS | Qty: 50

## 2019-05-06 MED FILL — KETAMINE 50 MG/5 ML (10 MG/ML) IN 0.9 % SODIUM CHLORIDE IV SYRINGE: 50 mg/5 mL (10 mg/mL) | INTRAVENOUS | Qty: 5

## 2019-05-06 MED FILL — SODIUM CHLORIDE 0.9 % INJECTION SOLUTION: INTRAMUSCULAR | Qty: 10

## 2019-05-06 MED FILL — CYKLOKAPRON 1,000 MG/10 ML (100 MG/ML) INTRAVENOUS SOLUTION: 1000 mg/10 mL (100 mg/mL) | INTRAVENOUS | Qty: 10

## 2019-05-06 MED FILL — KETOROLAC 15 MG/ML INJECTION SOLUTION: 15 mg/mL | INTRAMUSCULAR | Qty: 15

## 2019-05-06 MED FILL — ASPIRIN 325 MG TABLET: 325 mg | ORAL | Qty: 1

## 2019-05-06 MED FILL — PANTOPRAZOLE 40 MG TABLET,DELAYED RELEASE: 40 mg | ORAL | Qty: 1

## 2019-05-06 MED FILL — MAGNESIUM SULFATE 2 GRAM/50 ML (4 %) IN WATER INTRAVENOUS PIGGYBACK: 2 gram/50 mL (4 %) | INTRAVENOUS | Qty: 50

## 2019-05-06 MED FILL — PROMETHAZINE 25 MG TABLET: 25 mg | ORAL | Qty: 1

## 2019-05-06 MED FILL — MELOXICAM 7.5 MG TABLET: 7.5 mg | ORAL | Qty: 2

## 2019-05-06 NOTE — H&P (Signed)
I have assessed the patient.    No interval change in H&P.    Hospital Outpatient Surgery Patient Class Confirmed.

## 2019-05-06 NOTE — Op Note (Signed)
TOTAL KNEE REPLACEMENT OP NOTE    DATE OF PROCEDURE: 05/06/19    PRE and POST OPERATIVE DIAGNOSIS: Left Knee degenerative joint disease, medial compartment, Grade IV.   Additional diagnoses include: None. Pre-surgery ROM 10-90 degrees. Post 0-120      INDICATIONS: This is a 68 y.o. female with the above diagnoses. They have failed conservative arthritis care. They continue to have limitations of ADLs, lifestyle, and walking. Pain has limited their life for greater than 6 months. X-rays of the affected joint have been obtained within 6 months. Conservative arthritis care is documented in the patient's office chart. An extended PARQ has been held and documented in the patient's office chart.       REALISTIC EXPECTATIONS:   The patient has been counseled that they are at an increased risk of dissatisfaction with surgery secondary to Depression/Anxiety and chronic pain syndrome with 50% increased risk of dissatisfaction with surgical outcomes.    PROCEDURE: Total knee replacement, left, using a DePuy Johnson and [employer] PFC sigma. Components and component sizes include   Implant Name Type Inv. Item Serial No. Manufacturer Lot No. LRB No. Used Action   CEMENT BONE 20G - [id number]  CEMENT BONE 20G  DEPUY SYNTHES SALES ([place]) [id number] Left 3 Implanted   TRAY TIBIAL CEM MOD COCR SIZE2.5 - [id number]  TRAY TIBIAL CEM MOD COCR SIZE2.5  DEPUY SYNTHES SALES ([place]) [id number] Left 1 Implanted   COMPONENT FEMORAL CEM POSTERIOR STABALIZED SIZE2.0 LT - [id number]  COMPONENT FEMORAL CEM POSTERIOR STABALIZED SIZE2.0 LT  DEPUY SYNTHES SALES ([place]) [id number] Left 1 Implanted   INSERT TIBIAL STB GVF SIZE2.5 10. - [id number]  INSERT TIBIAL STB GVF SIZE2.5 10.0MM  DEPUY SYNTHES SALES ([place]) [id number] Left 1 Implanted   DOME PAT ROUND - [id number] Patella DOME PAT ROUND  DEPUY SYNTHES SALES () [id number] Left 1 Implanted   . All components cemented DePuy 2 dement. Sub-vastus approach.     SURGEON: Ladonna Snide,  MD    FIRST ASSISTANT: Nurse Practitioner: Mayer Masker, NP: An assistant was required for the entirety of this case and could not have been performed without one. The assistant's duties included, but were not limited to irrigation, suction, hemostasis, positioning, retraction, exposure, and partial closure.    SECOND ASSISTANT: None    ANESTHESIA: Spinal Exparel MGSO4 sedation    ESTIMATED BLOOD LOSS: * 50 ml - 05/06/2019  2:02 PM to 05/06/2019  3:00 PM *    IV ANTIBIOTICS:   Recent Abx Admin                   vancomycin (VANCOCIN) injection (mg) 1,000 mg Given 05/06/19 1426    ceFAZolin (ANCEF) 2 g in D5W IVPB Premix (g) 2 g Given 05/06/19 1348             Given within 1 hour of surgical cut time.    HEMOSTATIC AGENT: Transexamic acid 2mg . Given within 1 hour of surgical cut time.    DRAINS: no drain    FOLEY: None    COMPLICATIONS: None    PROCEDURE NOTE:   The patient was taken to the operating room, placed under anesthesia, and prepped and draped using sterile technique. The patient received appropriate antibiotic and hemostatic agent per protocol. Time out was held per protocol. Spinal check was performed. Incision made just medial to the patella. Local anesthetic/Exparel mixture injected in the fat layer overlying the fascia. Subvastus approach was performed. Supra-patellar pouch was exposed. Local anesthetic/Exparel  mixture injected in the synovial/periosteal layer overlying the proximal femur approximately 6-8 cm proximal to the articular surface. Medial release performed. Medial and lateral meniscus excised. ACL/PCL release from proximal tibia. Proximal tibia exposed. Tibial cutting guide placed to take appropriate bone resection off of the proximal tibia. Retractors placed to protect soft tissues. Proximal tibial resection performed. Tibia sized. Intramedullary guide placed in the femur. A 10mm distal femoral resection was taken. Femur sized in AP and lateral planes and compared to templating. Ranawat  block technique used to determine best femoral component rotation and position. All cuts were made. Components were trialed. Appropriate tibial rotation determined by floating tibial component through full range of motion.     The knee had full extension to 120 degrees of flexion. No medial/lateral instability. No anterior drawer. No lift off. No Lachman. Centrally tracking patella. Tibia was punched and drilled. Patella was cut, sized, and drilled. Treated the posterior knee capsule without the Aquamantys. Posterior knee was infiltrated with local anesthetic/Exparel per standard protocol to complete the femoral block. The knee was irrigated, bone was dried, and components cemented in place. Excess cement was debrided. Knee was brought into full extension. Dilute iodine and hydrogen peroxide solution instilled into the joint cavity and left undisturbed for more than 3 minutes. Once cement was hard and hot, excess cement was debrided, and knee motion/stability re-trialed. Finding of knee range of motion, stability, and patella tracking as above. Hemostasis was obtained a final time. Irrigated with all remaining fluids. Joint cavity was dried, a gram of Vancomycin powder was placed in the joint cavity, and the knee was closed per standard protocols. Stratafix suture was used to augment closure of the extensor mechanism and the deep dermal layer, Dermabond Prineo utilized for final skin closure. Aquacel dressing placed. Compression elastic wrap placed. Patient was taken to the recovery room in stable condition.       ASSESSMENT: This is a patient status post uncomplicated total knee replacement, left.    PLAN: Proceed forward with in house physical therapy for standard rehab (exercises, icing/elevation, stretching), mobilization, ambulation, walker training, ADLs, and home safety training from Dr. Zenaida Niece Horne's pink After-Surgery Activity calendar and Home instructions. Nursing for wound care/dressing education. Nursing  for after surgery medication education from Dr. Zenaida Niece Horne's pink After Surgery Medication Calendar. Plan for discharge home when patient is safe for that environment.     PRECAUTIONS AND RESTRICTIONS: Use walker until deemed safe to go to cane by Physical Therapy. Precautions and restrictions as outlined in Home Instruction handout. Special restrictions: WBAT    PHYSICAL THERAPY: Will continue outpatient PT per Dr. Zenaida Niece Horne's standard protocols.     VTE PROPHYLAXIS: Sequential calf squeezers, knee high TEDs, Coumadin, aspirin in the surgical facility. After discharge from surgical facility: Aspirin 325 mg po daily 6 weeks; No Warfarin.     NON-NARCOTIC/OPIOID PAIN PROGRAM: Extended Release Acetaminophen (Tylenol Arthritis) 650mg  with meals and night time snack. NSAID-Meloxicam 15mg  daily. Vitamin C 1gm with breakfast and dinner. Melatonin 5mg  1 hour before bedtime. Patient and JointCoach received repeated counseling on importance of use of non-narcotic pain program for 6-12 weeks as well as the benefits/risks.     PATIENT SPECIFIC NARCOTIC/OPIOID PAIN MEDICATION PROGRAM:  Oxycodone 5 1/2 PO after meals and bedtime snack with 1/2-1 tabs Q6 hrs PRN. Patient counseled to discontinue narcotic/opioid pain medication as soon as possible. Complications and side effects of even short-term narcotic use have been discussed at length with the patient and JointCoach. Repeat counseling  today.     CHRONIC NARCOTIC/OPIOID USE: None    ANTINAUSEA/ITCH: Primary Anti-Nausea Medication: Promethazine 25mg  PO before meals and night time snack.     GASTRIC PROTECTION: Omeprazole 20mg  daily    HOME ANTIBIOTIC: Cefadroxil 500 mg 2 tabs BID x 7 days for one week    CONSTIPATION PREVENTION: Miralax and Colace while on narcotics to prevent constipation. Instructed to follow constipation protocol in home instructions.    FOLLOW UP APPOINTMENTS: Patient to follow up in office in 4 weeks and at 8 weeks.   At 4 weeks, obtain standard 4 week post  replacement x-rays.   At 8 weeks, joint evaluation survey/questionnaires.   At 2 years, phone follow up combining with all knee/hip total joint replacements.   At 4 years follow up in office for x-rays of all knee/hip replacements.     Ladonna Snide MD.

## 2019-05-06 NOTE — Anesthesia Post-Procedure Evaluation (Signed)
Patient Name: Hannah Mann  Procedures performed: Procedure(s):  TOTAL KNEE ARTHROPLASTY    Last Vitals:   Vitals Value Taken Time   BP 114/57 05/06/2019  3:35 PM   Pulse 74 05/06/2019  3:35 PM   Resp 19 05/06/2019  3:35 PM   SpO2 100 % 05/06/2019  3:35 PM   Temp 36 ?C 05/06/2019  3:25 PM       Planned Anesthesia Type: spinal  Final Anesthesia Type: spinal  Patients Current Location: PACU  Level of Consciousness: awake, alert, responds appropriately and oriented  Post Procedure Pain:adequate analgesia  Airway: Patent  Respiratory Status: room air  Cardio Status: hemodynamically stable  Hydration: adequately hydrated  PONV prophylaxis Ordered  PONV? NO  no anesthesia complication,       planned opioid use           The patient was able to participate in the post op evaluation    Comments:      Keith Rake, CRNA  3:50 PM

## 2019-05-06 NOTE — Progress Notes (Signed)
Respiratory Assessment    Evaluating Therapist: Celine Mans    Subjective:  Patient interviewed / chart reviewed - Patient Hannah Mann is a 68 y.o. female  DOB: [birth year]  Patient Active Problem List   Diagnosis SNOMED CT(R)   . Lung nodule NODULE OF LUNG   . Migraine MIGRAINE   . Osteopenia OSTEOPENIA   . Diverticulosis DIVERTICULAR DISEASE   . Hx of colonic polyps HISTORY OF POLYP OF COLON   . Osteoarthritis OSTEOARTHRITIS   . Malignant neoplasm of bronchus and lung, unspecified site (CMS/HCC) MALIGNANT NEOPLASM OF LOWER RESPIRATORY TRACT   . Malignant neoplasm middle lobe, bronchus or lung (CMS/HCC) MALIGNANT NEOPLASM OF MIDDLE LOBE, BRONCHUS OR LUNG   . 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 Snoring SNORING   . Primary osteoarthritis of right knee OSTEOARTHRITIS OF RIGHT KNEE JOINT   . Primary osteoarthritis of left knee OSTEOARTHRITIS OF LEFT KNEE JOINT     Social History     Tobacco Use   Smoking Status Passive Smoke Exposure - Never Smoker   Smokeless Tobacco Never Used     Patient reports no respiratory difficulties and no history of respiratory disease.  Patient reports no respiratory medications currently in use.  No oxygen in use, no BiPAP/CPAP in use.    Objective:  Patient is fully awake, alert and oriented, answering all questions appropriately.  Current Vital Signs: BP (!) 126/58   Pulse 80   Temp 36 ?C (96.8 ?F) (Temporal)   Resp 21   Ht 157.5 cm (62)   Wt 62.1 kg (136 lb 12.8 oz)   SpO2 97%   BMI 25.02 kg/m?   No dyspnea or shortness of breath noted or reported.  Respiratory pattern is regular and chest expansion is symmetrical.  Breath sounds are clear bilaterally via auscultation.    Assessment:  Pt primary admitting Dx: Osteoarthritis of left knee, unspecified osteoarthritis type [M17.12]  No need for respiratory intervention at this time.    Plan:  Remove patient from Respiratory Therapy Eval. and Treat Protocol at this  time.  Will reassess if patients condition changes.    Bronchodilator:  No indication for bronchodilator therapy at this time.  GOAL: Maintain a patent airway, maximize airflow, decrease work of breathing and prevent  pulmonary compromise.    Supplemental Oxygen:  Room air Sp02 = 90%  Supplemental oxygen is not indicated.  GOAL: Maintain adequate Sp02/Pa02 and prevent pulmonary compromise.    Pulmonary/Bronchial Hygiene:  Pt encouraged to DB&C  Encourage ambulation  GOAL: Minimize atelectasis. Maintain a patent airway, improve bronchial clearance and prevent pulmonary compromise.    Electronically Signed:   Celine Mans

## 2019-05-06 NOTE — Brief Op Note (Signed)
Brief Operative Note    Procedure(s) (LRB):  TOTAL KNEE ARTHROPLASTY (Left)     Preoperative Diagnoses:   Osteoarthritis of left knee, unspecified osteoarthritis type [M17.12]    Postoperative Diagnoses:  Post-Op Diagnosis Codes:     * Osteoarthritis of left knee, unspecified osteoarthritis type [M17.12]     Surgeons: Surgeon(s) and Role:     * Tyna Jaksch, MD - Primary    Assistant(s): Nurse Practitioner: Mayer Masker, NP     Anesthesia Provider: CRNA: Keith Rake, CRNA    Anesthesia Type: Spinal    Height & Weight:157.5 cm (62) & 62.1 kg (136 lb 12.8 oz)     Specimens:   ID Type Source Tests Collected by Time Destination   A : bone fragments  Tissue Knee, Left TISSUE EXAM Tyna Jaksch, MD 05/06/2019 1416               Implants:  Implant Name Type Inv. Item Serial No. Manufacturer Lot No. LRB No. Used Action   CEMENT BONE 20G - [id number]  CEMENT BONE 20G  DEPUY SYNTHES SALES ([place]) [id number] Left 3 Implanted   TRAY TIBIAL CEM MOD COCR SIZE2.5 - [id number]  TRAY TIBIAL CEM MOD COCR SIZE2.5  DEPUY SYNTHES SALES ([place]) [id number] Left 1 Implanted   COMPONENT FEMORAL CEM POSTERIOR STABALIZED SIZE2.0 LT - [id number]  COMPONENT FEMORAL CEM POSTERIOR STABALIZED SIZE2.0 LT  DEPUY SYNTHES SALES ([place]) [id number] Left 1 Implanted   INSERT TIBIAL STB GVF SIZE2.5 10. - [id number]  INSERT TIBIAL STB GVF SIZE2.5 10.0MM  DEPU[employer] SALES ([place]) [id number] Left 1 Implanted   DOME PAT ROUND - [id number] Patella DOME PAT ROUND  DEPU[employer] SALES ([place]) [id number] Left 1 Implanted       Antibiotics (admin):   Recent Abx Admin                   vancomycin (VANCOCIN) injection (mg) 1,000 mg Given 05/06/19 1426    ceFAZolin (ANCEF) 2 g in D5W IVPB Premix (g) 2 g Given 05/06/19 1348                Incision Time:   Anes Incision Time     Date Time Event    05/06/2019 1230 Ready for Procedure     1402 Incision / Procedure Start           Timeout Completed:  Timeouts     Marlo M. Blase Mess, RN at [facility]  May 06, 2019 1350 PDT     Timeout Details     Timeout type:  Pre-incision                Marlo M. Shipman, RN at [facility] May 06, 2019 1445 PDT     Timeout Details     Timeout type:  Sign-out                       Estimated Blood Loss: * 50ml - 05/06/2019  2:02 PM to 05/06/2019  2:59 PM *    Urethral Catheter:      Findings: djd    Complications: None     Disposition: PACU - hemodynamically stable.    Ladonna Snide

## 2019-05-06 NOTE — Anesthesia Procedure Notes (Signed)
Spinal Block    Patient location during procedure: OR  Start time: 05/06/2019 1:40 PM  End time: 05/06/2019 1:46 PM  Staffing  Resident/CRNA: Keith Rake, CRNA  Performed: CRNA   Preanesthetic Checklist  Completed: patient identified, site marked, surgical consent, pre-op evaluation, timeout performed, IV checked, risks and benefits discussed and monitors and equipment checked  Spinal Block  Patient position: sitting  Prep: ChloraPrep  Patient monitoring: heart rate, cardiac monitor, continuous pulse ox and frequent blood pressure checks  Approach: midline  Location: L4-5  Injection technique: single-shot  Needle  Needle type: pencil-tip   Needle gauge: 25 G  Additional Notes  Sterile prep with gloves, drape, mask and hat. Single attempt with no needle redirection. VSS throughout the procedure.

## 2019-05-06 NOTE — Other (Signed)
Physical Therapy Evaluation    Patient name:  Hannah Mann  Date:  05/06/2019    ASSESSMENT    After evaluation and treatment, patient is safe and independent with bed mobility, transfers with FWW, community distance gait with FWW and stairs per home environment.    Discharge Recommendations: home with OPPT    PATIENT INFORMATON    Primary Diagnosis:    Patient Active Problem List   Diagnosis SNOMED CT(R)   . Lung nodule NODULE OF LUNG   . Migraine MIGRAINE   . Osteopenia OSTEOPENIA   . Diverticulosis DIVERTICULAR DISEASE   . Hx of colonic polyps HISTORY OF POLYP OF COLON   . Osteoarthritis OSTEOARTHRITIS   . Malignant neoplasm of bronchus and lung, unspecified site (CMS/HCC) MALIGNANT NEOPLASM OF LOWER RESPIRATORY TRACT   . Malignant neoplasm middle lobe, bronchus or lung (CMS/HCC) MALIGNANT NEOPLASM OF MIDDLE LOBE, BRONCHUS OR LUNG   . 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 Snoring SNORING   . Primary osteoarthritis of right knee OSTEOARTHRITIS OF RIGHT KNEE JOINT   . Primary osteoarthritis of left knee OSTEOARTHRITIS OF LEFT KNEE JOINT     Surgery:  Procedure(s) (LRB):  TOTAL KNEE ARTHROPLASTY (Left)    Days Post surgery:  Day of Surgery    Pertinent Medical/Surgical Hx:    Past Medical History:   Diagnosis Date   . Carcinoma of the skin, basal cell     left leg   . Chronic pain     knees   . Diverticulitis    . Hemorrhoids    . History of colonic polyps    . History of migraine headaches    . Lung nodule    . Osteoarthritis    . Osteopenia    . PONV (postoperative nausea and vomiting)      Past Surgical History:   Procedure Laterality Date   . appendectomy  1980   . CHOLECYSTECTOMY  2002   . COLONOSCOPY     . HYSTERECTOMY     . KNEE SURGERY Right 1996    arthroscopy   . PROCEDURE Right 08/06/2013    Procedure: THORACOSCOPY; mediastinal lymph node disection; MIDDLE LOBECTOMY;  Surgeon: Johnsie Kindred, MD;  Location: [facility] OR;  Service: Cardiology;  Laterality:  Right;   . PROCEDURE Right 02/23/2015    Procedure: R-TOTAL KNEE ARTHROPLASTY;  Surgeon: Tyna Jaksch, MD;  Location: [facility] OR;  Service: Orthopedics;  Laterality: Right;     Co-morbidities affecting Plan of Care:  Osteoporosis, chronic knee pain    Symptom and general health characteristics:    moderate complexity evaluation (evolving, 1-2 co-morbidities impacting POC and 3+ elements)    Precautions:  WBAT LLE     Hx of Onset:  68 y.o. female admitted for L TKA and referred to PT for mobility assessment and discharge planning recommendations.    PLOF:  Active and independent in home and community    Social Situation:  Lives with husband in [place], [place]    Home Accessibility/ Stairs:   2 single stairs to enter     Equipment:  FWW, SPC    SUBJECTIVE    Eager to mobilize.    Pain: Pain does not limit patient's functional ability at present.     OBJECTIVE    Appliances:  none    Observation:  Alert and spry    Mental Status/LOC/Orientation:  A&Ox4    Sensation:  Functional   Strength:  Functional,  able to SLR, LAQ, heel raise x10  ROM:  Functional     Balance:  Sit Static: n  Sit Dynamic: f  Stand Static: f  Stand Dynamic: f    Bed Mobility: Safe and independent      Transfers:  Safe and independent with FWW    Gait:   Hannah Mann walked 350 feet with FWW and no assist.    Specific Gait Issues/Limitations:  None apparent    Stairs: up/down safe and indep per home    Endurance:  good    Vitals: No significant changes in vital signs noted.     Today's Treatment:    Evaluation Completed and Goals addressed.  Patient left in room in wheelchair extension stretch with call light.  Phone left within reach.  Instructed in benefits of increased out of bed time.  Encouraged patient to be out of bed for 2 hours.    Educated and instructed in precautions, bed exercises, AD fitting and use.    Bed based exercises provided to improve independence with bed mobility.  Hannah Mann required min  assist and cueing for set up and technique.  Seated and standing exercises provided at bedside to improve independence and safety with all functional transfers.  Hannah Mann required min assist and cueing for set up and technique.    PLAN    x1 treatment (at this eval), then expect DC today.    Patient/Caregiver GOALS reviewed and integrated with rehab treatment plan:  Multidisciplinary Problems     Multidisciplinary Problems (Active)        Problem: IP General Goal List - 1    Goal Priority Disciplines Outcome   Ambulation - Levels     PT    Description:  Patient to ambulate independently x 350 feet with appropriate assistive device.   Ambulation - Stairs     PT    Description:  Pt. to ascend/descend stairs independently, as per discharge environment.                   Patient/Family Goal:  Leave hospital, safety    Therapist/License #: Terance Ice, PT / [phone]    Start Time: 1730 /Stop Time: 1815    Terance Ice, PT   05/06/2019        Timed minutes: 45   TOTAL minutes: 45     $ PT Eval - Moderate Complexity (CPT 97162): Untimed single unit  $ Gait training (CPT L092365): 1 unit  $ Ther Exercises (CPT 97110): 1 unit

## 2019-05-06 NOTE — Anesthesia Pre-Procedure Evaluation (Addendum)
Anesthesia Evaluation     Patient summary reviewed    History of anesthetic complications  PONV   Airway   Mallampati: II  TM distance: 6.5 - 8 cm  Neck ROM: full  Dental - normal exam     Pulmonary - negative ROS and normal exam    breath sounds clear to auscultation  Cardiovascular - negative ROS and normal exam  Exercise tolerance: good (4-7 METS)    Rhythm: regular  Rate: normal    Neuro/Psych    (+) headaches,     GI/Hepatic/Renal - negative ROS     Endo/Other - negative ROS    Musculoskeletal - negative ROS                   Anesthesia Plan    ASA 2     spinal   (PARQ for Spinal and General discussed with pt. Risks for infection, nerve injury, high spinal, post puncture headache, failed spinal, the need to convert to GA along with  PONV, sore throat, aspiration, tooth, lip or oral pharynx damage, stroke, heart attack or death. Pt verbalizes understanding.)    intravenous induction         Anesthetic plan, risks and benefits discussed with patient.  Consenting person PARQ. understands and agrees to proceed.  Pre-Anesthesia Evaluation Completed at:  05/06/2019 12:30 PM

## 2019-05-06 NOTE — Consults (Signed)
History and Physical      Name: Hannah Mann  DOB: 1951-05-19 68 y.o.  MRN: [id number]  CSN: [id number]    History:     Chief Complaint:  Osteoarthritis of left knee, unspecified osteoarthritis type    History of Present Illness:  Hannah Mann is a 68 y.o. female who presents for evaluation of medical conditions concomitant to TJR at request of Dr Lorina Rabon. Symptoms have been severe. The symptoms have occurred over the last several yeras, and occur all day. The symptoms are aggravated by any ADL and relieved by No conservative measures.  Therefore, patient elects to have Surgery with Dr Lorina Rabon to improve ADL and reduce Pain.     I met with patient in Preop #1 and reviewed patients medical history.      Patient has not had CAD, CKD, COPD, OSA, DM, HTN, Angina,     Patient has had similar surgery in the past.  without complications.    Patient is able to climb 2 flights of stairs without SOB, DOE or Angina.    Patient has been cleared by Dr Nicanor Alcon.    We reviewed Preventive measures and intentional use of medications.  Cautions post operatively reviewed.     Patient appears to be ASA 1 surgical risk.        Past Medical History:  Past Medical History:   Diagnosis Date   . Carcinoma of the skin, basal cell     left leg   . Chronic pain     knees   . Diverticulitis    . Hemorrhoids    . History of colonic polyps    . History of migraine headaches    . Lung nodule    . Osteoarthritis    . Osteopenia    . PONV (postoperative nausea and vomiting)      Past Surgical History:  Past Surgical History:   Procedure Laterality Date   . appendectomy  1980   . CHOLECYSTECTOMY  2002   . COLONOSCOPY     . HYSTERECTOMY     . KNEE SURGERY Right 1996    arthroscopy   . PROCEDURE Right 08/06/2013    Procedure: THORACOSCOPY; mediastinal lymph node disection; MIDDLE LOBECTOMY;  Surgeon: Johnsie Kindred, MD;  Location: [facility] OR;  Service: Cardiology;  Laterality: Right;   . PROCEDURE Right 02/23/2015    Procedure:  R-TOTAL KNEE ARTHROPLASTY;  Surgeon: Tyna Jaksch, MD;  Location: [facility] OR;  Service: Orthopedics;  Laterality: Right;     Current Medications:  Prior to Admission Medications    Medication Dose & Frequency   acetaminophen ER (TYLENOL) 650 MG CR tablet Take 2 tablets by mouth 3 times daily.   ascorbic acid, vitamin C, (VITAMIN C) 500 MG tablet Take 2 tablets by mouth 2 times daily.   aspirin 325 MG tablet Take 1 tablet by mouth daily with lunch. Taking aspirin helps to Prevent Blood Clots.  Take for 6 weeks with Lunch.  See Dr Zenaida Niece [facility] After Surgery Med Instructions   cefadroxil (DURICEF) 1 gram tablet Take 1 tablet by mouth 2 times daily for 7 days.   docusate sodium (COLACE) 100 MG capsule Take 1 capsule by mouth 2 times daily. Take while on narcotic medications to prevent constipation.  If loose stools decrease to once a day.   gabapentin (NEURONTIN) 300 MG capsule Take 1 capsule by mouth every night at bedtime. May take for up to 12 weeks.   meloxicam (MOBIC) 15 MG  tablet Take 1 tablet by mouth daily with dinner. See Dr Zenaida Niece [facility] After Surgery Med Directions.   omeprazole magnesium (PRILOSEC OTC) 20 MG tablet Take 1 tablet by mouth before breafast.   oxyCODONE (ROXICODONE) 5 MG immediate release tablet See Dr Zenaida Niece [facility] pink  After Surgery Medication Calendar.  Take 1/2 tab by mouth after Breakfast, Lunch, Dinner and Bed Time Snack.  If necessary you can take an additional 1/2 to 1 tabs every 4 hrs for pain that would limit walking and exercise.  When injected numbing medication wears off (2-3 days), you should decrease regularly scheduled doses to Breakfast and Dinner and if necessary take 1/2-1 tab(s) every 4 hrs for pain.  The next day discontinue the Breakfast and Dinner doses and if necessary take 1/2-1 tab(s) every 4 hrs for pain.  Discontinue in all narcotics in 4-5 days.  This medication is habit forming causes sleep disturbance. Do not use as sleep aide.  Dispose of all unused  narcotics. Ask your pharmacy where and how.   polyethylene glycol (MIRALAX) 17 gram packet Take 1 packet by mouth 2 times daily. Take while using narcotic pain medications to prevent constipation.  If loose stools decrease to once a day.   promethazine (PHENERGAN) 25 MG tablet Take 1 tablet by mouth 4 times daily with meals and nightly for 14 days. Take 15 min before narcotic to prevent nausea.  Take additional 1/2-2 tab(s) every 6 hrs as needed for nausea.  See Dr Zenaida Niece [facility] After Surgery Med Directions.        Allergies:  Patient has no known allergies.    Family History:  Family History   Problem Relation Age of Onset   . Cancer Mother    . Diabetes Mother    . Cancer Father    . Tobacco Dependence Father      Social History:  Social History     Tobacco Use   . Smoking status: Passive Smoke Exposure - Never Smoker   . Smokeless tobacco: Never Used   Substance Use Topics   . Alcohol use: Yes     Comment: occasional   . Drug use: No     Immunization:    There is no immunization history on file for this patient.  Review of Systems:  A comprehensive review of systems was negative.    Physical Exam:     Vital Signs:  Initial Vitals   BP 05/06/19 1210 153/65   Pulse 05/06/19 1210 59   Resp 05/06/19 1210 12   Temp 05/06/19 1203 36.2 ?C (97.2 ?F)   SpO2 05/06/19 1210 99 %     Exam:  General: alert, no distress  Head: Normocephalic, atraumatic  Eyes: EOMI, non injected, no discharge  Nose: No discharge  Oropharynx: MMM, no lesions  Neck/Nodes: Supple, no lymphadenopathy  Chest: CTA bilaterally, no GFR  Cardiac: RRR, no murmur  Abdomen: Soft, NT, ND, no HSM, no masses  Extremities: Pink, perfused with no edema  Skin: No rash  Neurologic: Grossly non focal    Data:     Labs:  No results found for this visit on 05/06/19 (from the past 24 hour(s)).       Imaging for last 24 hours:  No results found.    Special Studies:  COvid 19 neg    Assessment:     Diagnoses:  Active Hospital Problems    Diagnosis SNOMED CT(R) Date  Noted   . Primary osteoarthritis of left knee OSTEOARTHRITIS OF LEFT  KNEE JOINT 05/06/2019   . Snoring SNORING 07/22/2014   . Osteopenia OSTEOPENIA 11/28/2012       Plan:   TJR orders are anticipated.  Discussed and gave information for Preventive Measures.  Discussed and gave information regarding Intentional Use of Medications.   Advised to use PNV and Fe after discharge.  Advised to see Dr Nicanor Alcon in 2 weeks to review surgery and hospitalization.  Nursing will call regarding any concerns or complications during stay in hospital.    Adjustments to medical program include Usual precautions discussed with patient and coach nd given in written form.        Denita Lung  05/06/2019  1:42 PM

## 2019-05-07 MED ORDER — polyethylene glycol (MIRALAX) packet 17 g
17 | Freq: Every day | ORAL | Status: DC
Start: 2019-05-07 — End: 2019-05-06

## 2019-05-07 MED ORDER — docusate sodium (COLACE) capsule 250 mg
250 | Freq: Every day | ORAL | Status: DC
Start: 2019-05-07 — End: 2019-05-06

## 2019-05-07 MED ORDER — aluminum-magnesium hydroxide-simethicone (MAALOX PLUS) 200-200-20 mg/5 mL oral suspension 30 mL
200-200-20 | ORAL | Status: DC | PRN
Start: 2019-05-07 — End: 2019-05-06

## 2019-05-07 MED ORDER — sodium chloride (OCEAN) 0.65 % nasal spray 2 spray
0.65 | NASAL | Status: DC | PRN
Start: 2019-05-07 — End: 2019-05-06

## 2019-05-07 MED ORDER — zolpidem (AMBIEN) tablet 5 mg
5 | Freq: Every evening | ORAL | Status: DC | PRN
Start: 2019-05-07 — End: 2019-05-06

## 2019-05-07 MED ORDER — prochlorperazine (COMPAZINE) suppository 25 mg
25 | Freq: Two times a day (BID) | RECTAL | Status: DC | PRN
Start: 2019-05-07 — End: 2019-05-06

## 2019-05-07 MED ORDER — ondansetron (ZOFRAN) injection 4 mg
4 | Freq: Four times a day (QID) | INTRAMUSCULAR | Status: DC
Start: 2019-05-07 — End: 2019-05-06

## 2019-05-07 MED ORDER — carboxymethylcellulose sodium (REFRESH TEARS) 0.5 % ophthalmic solution 2 drop
0.5 | OPHTHALMIC | Status: DC | PRN
Start: 2019-05-07 — End: 2019-05-06

## 2019-05-07 MED ORDER — ondansetron (ZOFRAN-ODT) disintegrating tablet
4 | Freq: Four times a day (QID) | ORAL | Status: DC
Start: 2019-05-07 — End: 2019-05-06

## 2019-05-07 MED ORDER — sodium chloride 0.9 % (NS flush) syringe 10 mL
INTRAMUSCULAR | Status: DC | PRN
Start: 2019-05-07 — End: 2019-05-06

## 2019-05-07 MED ORDER — HYDROmorphone (PF) (DILAUDID) injection 0.2-0.8 mg
0.5 | INTRAMUSCULAR | Status: DC | PRN
Start: 2019-05-07 — End: 2019-05-06

## 2019-05-07 MED ORDER — naloxone (NARCAN) injection 0.08 mg
0.4 | INTRAMUSCULAR | Status: DC | PRN
Start: 2019-05-07 — End: 2019-05-06

## 2019-05-07 MED ORDER — aspirin tablet 325 mg
325 | Freq: Every day | ORAL | Status: DC
Start: 2019-05-07 — End: 2019-05-06

## 2019-05-07 MED ORDER — acetaminophen (TYLENOL) tablet 1,300 mg
325 | Freq: Three times a day (TID) | ORAL | Status: DC
Start: 2019-05-07 — End: 2019-05-06

## 2019-05-07 MED ORDER — sodium chloride 0.9 % (NS flush) syringe 10 mL
INTRAMUSCULAR | Status: DC
Start: 2019-05-07 — End: 2019-05-06

## 2019-05-07 MED ORDER — meloxicam (MOBIC) tablet 15 mg
7.5 | Freq: Every day | ORAL | Status: DC
Start: 2019-05-07 — End: 2019-05-06

## 2019-05-07 MED ORDER — promethazine (PHENERGAN) tablet 12.5-25 mg
25 | ORAL | Status: DC | PRN
Start: 2019-05-07 — End: 2019-05-06

## 2019-05-07 MED ORDER — acetaminophen (TYLENOL) tablet 650 mg
325 | Freq: Three times a day (TID) | ORAL | Status: DC
Start: 2019-05-07 — End: 2019-05-06

## 2019-05-07 MED ORDER — benzocaine-menthoL (CEPACOL) lozenge 1 lozenge
15-2.6 | Status: DC | PRN
Start: 2019-05-07 — End: 2019-05-06

## 2019-05-07 MED ORDER — ceFAZolin (ANCEF) 2 g in D5W IVPB Premix
2 | Freq: Once | INTRAVENOUS | Status: DC
Start: 2019-05-07 — End: 2019-05-06

## 2019-05-07 MED ORDER — polyethylene glycol (MIRALAX) packet 17 g
17 | Freq: Two times a day (BID) | ORAL | Status: DC
Start: 2019-05-07 — End: 2019-05-06

## 2019-05-07 MED ORDER — gabapentin (NEURONTIN) capsule 300 mg
300 | Freq: Every evening | ORAL | Status: DC
Start: 2019-05-07 — End: 2019-05-06

## 2020-06-12 ENCOUNTER — Encounter: Admit: 2020-06-12

## 2020-06-12 ENCOUNTER — Ambulatory Visit: Admit: 2020-06-12

## 2020-06-12 ENCOUNTER — Encounter: Admit: 2020-06-12 | Discharge: 2020-06-12

## 2020-06-12 DIAGNOSIS — C7A090 Malignant carcinoid tumor of the bronchus and lung: Secondary | ICD-10-CM

## 2020-06-12 DIAGNOSIS — C3490 Malignant neoplasm of unspecified part of unspecified bronchus or lung: Secondary | ICD-10-CM

## 2020-06-12 LAB — COMPREHENSIVE METABOLIC PANEL
ALT - Alanine Aminotransferase: 15 U/L (ref 2–40)
AST - Aspartate Aminotransferse: 19 U/L (ref 2–35)
Albumin: 4.5 g/dL (ref 3.2–5.0)
Alkaline Phosphatase: 54 U/L (ref 20–125)
BUN: 17 mg/dL (ref 7–25)
CO2 - Carbon Dioxide: 28 mEq/L (ref 21.0–33.0)
Calcium: 9.3 mg/dL (ref 8.5–10.5)
Chloride: 104 mEq/L (ref 98–110)
Creatinine: 0.8 mg/dL (ref 0.5–1.3)
GFR Female: 71.3 mL/min (ref >60.0)
Glucose: 94 mg/dL (ref 65–120)
Potassium: 4.2 mEq/L (ref 3.5–5.3)
Protein, Total: 7.4 g/dL (ref 5.8–8.3)
Sodium: 139 mEq/L (ref 135–146)
Total Bilirubin: 0.4 mg/dL (ref 0.2–1.4)

## 2020-06-12 NOTE — Progress Notes (Signed)
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 Her history is summarized:    She presented with a growing right middle lobe nodule measuring 2 cm dating back to 2009. Pt underwent needle biopsy in 01/2013 with results showing a low grade NET c/w carcinoid. Ki67 <1%. She underwent Right middle lobectomy on 08/06/13 with pathology reviewed today. This showed a neuroendocrine tumor, low grade (typical carcinoid) 1.7 cm in size, with clear surgical margins. There were multiple small typical carcinoid tumorlets one focally contracting the parenchymal surgical margin. 0/5 lymph nodes all negative for involvement. pT1bN0 disease.    Prior staging studies included a CT scan of abd/pelvis on 02/19/13 that was negative for metastatic lesions in the liver, there was a small low attenuating focus involving the anterior liver thought to be focal fatty inflitration. Patient recovered well from her surgery. She had no complications from her surgery. She was seen in medical oncology  08/28/13 to review treatment recommendations.   A baseline   Octreoscan was ordered to make sure there there were no areas of uptake that would be consistent with other areas of carcinoid involvement but she never obtained that test.  She was lost to follow up and did not attend any follow up appointments.     Imaging  CT chest 06/12/20  IMPRESSION:  1.  Stable disease. No significant interval change in tiny LEFT lower lobe   pulmonary nodules.  ?  2.  Prior RIGHT middle lobectomy.  ?  3.  Colonic diverticulosis.      CT chest 06/14/19   IMPRESSION: 1. Stable exam. There are no findings to suggest progression. There are are multiple very small nodules in the lungs similar to prior. No new or enlarging  nodules are seen. Other findings as described above.    CT chest 11/21/18  IMPRESSION: No evidence of metastatic disease of the chest. Postsurgical changes of the RIGHT lung    CT c/a/p 12/2017   IMPRESSION: 1. No evidence of active malignancy to suggest disease recurrence. 2. Multiple incidental findings as described above.      Interval History     Pt presents today in f/u for  carcinoid of her lung.  She is here to review her most recent CT scan.     She feels well. Her only complaint is a dry cough when she is in air conditioning or if she smells perfumes.  She denies a history of allergies. cough is dry she does not have fevers or frequent infections.     She had covid in April, her sx were mild.  She felt like she had a head cold. She has not been vaccinated.      She had a knee replacement in July. She is recovering well from that.  She reports no other new changes.               Past Medical History:   Diagnosis Date   . Carcinoma of the skin, basal cell     left leg   . Chronic pain     knees   . Diverticulitis    . Hemorrhoids    . History  of colonic polyps    . History of migraine headaches    . Lung nodule    . Osteoarthritis    . Osteopenia    . PONV (postoperative nausea and vomiting)         Social History     Tobacco Use   . Smoking status: Passive Smoke Exposure - Never Smoker   . Smokeless tobacco: Never Used   Substance Use Topics   . Alcohol use: Yes     Comment: occasional   . Drug use: No        Social History     Tobacco Use   Smoking Status Passive Smoke Exposure - Never Smoker   Smokeless Tobacco Never Used      Married       Current Outpatient Medications:   .  polyethylene glycol (MIRALAX) 17 gram packet, Take 1 packet by mouth 2 times daily. Take while using narcotic pain medications to prevent constipation. If loose stools decrease to once a day., Disp: 28 each, Rfl: 0     No Known Allergies      Review of Systems    As noted above, otherwise 12 point ROS negative.     Review of  Systems    Physical Exam     Vitals:    06/12/20 1414   BP: 124/70   Pulse: 72   Resp: 16   Temp: 36.2 ?C (97.2 ?F)   TempSrc: Temporal   SpO2: 98%   Weight: 127 lb 9.6 oz (57.9 kg)   Height: 5' 1.75 (1.568 m)       Constitutional: Alert, cooperative, oriented. Mood and affect appropriate.  Psychiatric Coherent speech. Verbalizes understanding of our discussions today.    Physical Exam    Labs   Lab Results   Component Value Date    WHITEBLOODCE 11.3 (H) 08/07/2013    HGB 11.5 (L) 08/07/2013    HCT 33.4 (L) 08/07/2013    MCV 91.0 08/07/2013    LABPLAT 192 08/07/2013       Chemistry        Component Value Date/Time    NA 139 06/12/2020 1050    NA 134 (L) 08/07/2013 0432    K 4.2 06/12/2020 1050    K 3.8 08/07/2013 0432    CL 104 06/12/2020 1050    CL 103 08/07/2013 0432    CO2 28.0 06/12/2020 1050    CO2 24.0 08/07/2013 0432    BUN 17 06/12/2020 1050    BUN 9 08/07/2013 0432    GLU 94 06/12/2020 1050    GLU 141 (H) 08/07/2013 0432        Component Value Date/Time    CALCIUM 9.3 06/12/2020 1050    CALCIUM 8.1 (L) 08/07/2013 0432    ALKPHOS 54 06/12/2020 1050    ALKPHOS 47 08/05/2013 1111    AST 19 06/12/2020 1050    AST 29 08/05/2013 1111    ALT 15 06/12/2020 1050    ALT 27 08/05/2013 1111    BILITOT 0.6 08/05/2013 1111               Ongoing Care Management  ECOG:   0  Pain level: Pain Score: 0-No pain  Depression Screen: PHQ-2 Score: 0 (06/12/2020  2:18 PM)        Advanced Care Planning       Assessment & Plan:  1. 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 Interestingly, there was one surgical margin were a small carcinoid tumorlet was focally contacting the parenchymal surgical margin.  The patient did well with surgery and was asymptomatic, she did not experience any  symptoms of carcinoid syndrome. She was recommended octreoscan as a baseline and if negative, surveillance. She was lost to  follow up, did not obtain the octreoscan, and did not show for her follow up appointments.     - she then re-established care. Octreoscan in 12/2017 showed no metastatic disease. Subsequently she has been followed with annual imaging.   She has been feeling well and has no systemic complaints or concerns. Specifically denies symptoms of wheezing, flushing or diarrhea.         - Her most recent CT scan of chest from today, 06/12/20 shows no findings to suggest progression. Very small nodules were noted in the lungs and she is without change compared to prior. She remains asymptomatic.    - She is nearly 7 years out from her surgery. She can be followed on an annual basis with annual imaging.   We did discuss concerning symptoms to be aware of like wheezing, diarrhea, abdominal pain, weight loss to call to be seen for evaluation. She voiced understanding.     2. Covid vaccine  She was encouraged to get vaccinated      Due to the extraordinary challenges presented in delivering initial and ongoing oncology patient care during the COVID 19 pandemic, this visit was conducted by remote telemedicine patient access. We believe that to protect the health of our clinical staff, the patient, and their caregivers, this less than ideal methodology was the most prudent and ethical option left to [place]. I have explained to the patient the limitations that may be inherent to this process and they have verbally consented to have [place] manage their care in this way until circumstances in the pandemic improve.        Maryanna Shape, DO        We retain the right to modify this information without notice in the event of errors. Occasional errors in punctuation, grammar, and content occur. The patient has been advised to follow up with appointments. Patient has been informed that non-compliance with this recommendation could result in serious consequences because of delays in diagnosis and treatment. It also has been disclosed to the patient that we  expect patients to take an active role in their health care and never assume that no news is good news.

## 2020-06-12 NOTE — Progress Notes (Signed)
DOB Verified:yes    Labs:06/12/20    Vitals taken:yes    Pain level/Location of pain:none    Allergies verified (yes/no):no    Medications verified/refills (yes/no):none    Pharmacy Verified:[facility]    ER Visits (when/where):no    Imaging:CT 06/12/20    PhQ (+/-):Neg    Flu sx (yes/no, if yes what are the sx):no    Covid Vaccine: no    Pt consented to telemedicine & #: In office Doxy

## 2020-12-24 NOTE — H&P (Signed)
Hannah Mann  Documented: 11/20/2020 8:15 AM  Location: [facility]  Patient #: [id number]  DOB: [birth year]  Married / Language: [place] / Race: White  Female      History of Present Illness (Julianne E. Freida Busman MD; 11/20/2020 12:11 PM)  The patient is a 70 year old female.    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 ?Patient reports about 38 year history of hemorrhoids following child birth. ?Symptoms include prolapse, bleeding, and intermittent pain associated with inflammation. ?She attempted banding of internal hemorrhoids about 30 years ago, but was syncopal after the procedure so the bands were removed. ?She has tried conservative measures including high fiber diet, bowel regimen, avoiding straining, and preparation H multiple times in the past without relief. ?Bleeding occurs with every bowel movement. ?She only has intermittent constipation. ?Her last colonoscopy was 11/28/18 which showed external hemorrhoids, grade II internal hemorrhoids, diverticula, and two sessile polyps in the ascending colon that were hyperplastic on pathology. ?Denies abdominal pain, weight loss, family history of malignancy.    Problem List/Past Medical (Erynn C, CMA; 11/20/2020 11:11 AM)  Non-smoker (Z78.9) ?    Allergies (Erynn C, CMA; 11/20/2020 11:11 AM)  No Known Drug Allergies ?[11/20/2020]:  Allergies Reconciled ?    Family History Scotty Court, CMA; 11/20/2020 11:11 AM)  Diabetes Mellitus ?  Heart Disease ?  Other: No family history of bleeding disorders ?    Social History Ulyses Southward C, CMA; 11/20/2020 11:10 AM)  Alcohol use ? 11/20/2020: Occasionally uses alcohol  HIV risk factors ? 11/20/2020: I am not concerned about HIV risk  Illicit drug use ? 11/20/2020: Never used illicit drugs  Tobacco use ? Never smoker.    Medication History (Erynn C, CMA; 11/20/2020 11:14 AM)  No Current  Medications?  Medications Reconciled?    Past Surgical History Ulyses Southward C, CMA; 11/20/2020 11:10 AM)  Appendectomy ?  Gall Bladder ?  Hysterectomy ? non-cancerous  Lung ?    Other Problems (Erynn C, CMA; 11/20/2020 11:11 AM)  Arthritis (M19.90) ?        Review of Systems (Erynn C CMA; 11/20/2020 11:11 AM)  General Not Present- Fatigue, None, Persistent Infections, Weight Gain and Weight Loss.  Skin Not Present- Dryness, New Lesions and Rash.  HEENT Present- Seasonal Allergies and Wears glasses/contact lenses. Not Present- Hearing Loss, Oral Ulcers, Sinus Pain and Visual Disturbances.  Respiratory Not Present- Bloody sputum, Chronic Cough, Difficulty Breathing, Sleep apnea and Wheezing.  Cardiovascular Not Present- Chest Pain, Difficulty Breathing On Exertion, Palpitations, Shortness of Breath and Swelling of Extremities.  Gastrointestinal Present- Constipation and Hemorrhoids. Not Present- Abdominal Pain, Bloody Stool, Change in Bowel Habits, Chronic diarrhea, Excessive gas, Indigestion, Nausea and Vomiting.  Female Genitourinary Not Present- Blood in Urine, Frequency, Incontinence and Pelvic Pain.  Neurological Not Present- Decreased Memory, Difficulty Speaking, Fainting, Headaches, Numbness, Seizures, Trouble walking and Weakness In Extremities.  Psychiatric Not Present- Anxiety, Depression and Suicidal Ideation.  Hematology Not Present- Clotting Problems, Easy Bruising and Excessive bleeding.    Vitals (Erynn C CMA; 11/20/2020 11:15 AM)  11/20/2020 11:14 AM  Weight: 131.6 lb?? Height: 61.5?in?  Height was reported by patient.  Body Surface Area: 1.59 m??? Body Mass Index: 24.46 kg/m? ?  Temp.: 97.3??F?(Thermal Scan) ?? Pulse: 70 (Regular) ?? P.OX: 98% (Room air)  BP: 120/80 (Sitting, Left Arm, Standard)  Physical Exam (Julianne E. Freida Busman MD; 11/20/2020 12:11 PM)  General  Mental Status?-?Alert.  General Appearance?-?Consistent with stated age, Not in acute distress.  Mood and Affect?-?Normal.  Hydration?-?Well  hydrated.  Voice?-?Normal.    Integumentary  General Characteristics  Skin - no rashes, no lesions. Skin Moisture - normal skin moisture. Hair Distribution and Texture - normal distribution of scalp and body hair, with normal hair texture.    Head and Neck  Face  Examination of the face reveals - atraumatic, symmetric.  Neck - Did not examine.  Thyroid - Did not examine.    Eye  Vision?-?Visual fields without obvious defect.  Upper Eyelid - Bilateral?-?Normal.  Pupil - Bilateral?-?Equal and Round.    Chest and Lung Exam  Chest and lung exam reveals ?-?on auscultation, normal breath sounds, no adventitious sounds and normal vocal resonance.    Cardiovascular  Cardiovascular examination reveals ?-?normal heart sounds, regular rate and rhythm with no murmurs.    Abdomen  Palpation/Percussion  Palpation and Percussion of the abdomen reveal - Soft, Non Tender, No Rebound tenderness, No Rigidity (guarding), No hepatosplenomegaly and No Palpable abdominal masses.    Rectal  Note:? Circumferential non thrombosed external hemorrhoids. DRE without mass, likely internal hemorrhoids. No blood.      Peripheral Vascular - Did not examine.    Neurologic  Neurologic evaluation reveals ?-?alert and oriented x 3 with no impairment of recent or remote memory, normal attention span and ability to concentrate, normal visual acuity and symmetrical functioning of facial nerves.  Coordination?-?Normal.    Musculoskeletal  Global Assessment  Gait and Station - normal gait and station and normal posture. Head and Neck - symmetric, no deformities, masses or tenderness, no known fractures. Spine, Ribs and Pelvis - no deformities, malalignments, masses or tenderness, no known fractures. Left Upper Extremity - normal strength and tone. Left Lower Extremity - normal strength and tone.    Lymphatic - Did not examine.        Assessment & Plan (Julianne E. Freida Busman MD; 11/20/2020 12:12 PM)  External hemorrhoid, bleeding (K64.4)  Current Plans  We had a  full PARQ discussion regarding resection of the hemorrhoids. Questions were encouraged and answered.? Alternative treatments were discussed.? I used a patient educator pamphlet to describe the anatomy and procedure. I placed specific emphasis on the risk of damage to the anal sphincter muscles resulting in fecal incontinence or anal stricture.? Also, bleeding and infection.? Further the hemorrhoids can recur of the hemorrhoids.? These problems are unusual and generally this procedure is well tolerated.  Level 4 - Elective (essential) 1-3 months: discussed with patient and provided information.  Surgery Order: [facility]: completed.  Surgery Order: Outpatient same day discharge: completed.  Surgery Order: 30 Minutes: completed.  Schedule within 1 month: completed.  Pt Education - How to access health information online: discussed with patient and provided information.  Surgery Order: Assist- MLP: completed.  Surgery Order: Post Op Follow Up - with Surgeon 10-14 days: completed.  Surgery Orders: Position - Lithotomy: completed.  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 ?She has failed  conservative measures and banding in the past. ?She is up to date on colonoscopy.        Signed electronically by Concha Pyo, MD (11/20/2020 12:12 PM)

## 2020-12-30 NOTE — Procedures (Signed)
Phone Screen:  Pre surgery instructions reviewed with Pt.    NPO p 2400 night before surgery/ (includes gum,mints and lozenges)  (no bowel prep per surgeon)     MEDICATIONS INSTRUCTIONS with sip water AM of surgery: None   STOP all herbs/supplements, fish oil and NSAIDS 1 week prior to surgery  (ASA and anticoagulants per surgeon instruction)     CHG/Hibiclens shower per protocol reviewed - has  DAY OF SURGERY:  NO smoking, chewing, vaping or drug use, if applicable  DO NOT BRING: medications, valuables or jewelry to hospital   BRING ONLY:  rescue inhaler, CPAP and eye drops, if applicable  LOCATION:  Entrance B  DRIVER needed upon discharge     Contact Person/Driver:  Spouse Fernando: [phone]    OSA Score: 1  MRSA screen:  Negative   =======================================  Arrival time: 0830 for 1030 surgery                        COVID-19 Screen:  COVID test: 01/05/2021 at [facility]     For past two weeks Pt denies: Fever, Cough, SOB, exposure to confirmed + COVID-19 persons, or travel outside the [place].  Pt agrees and understands to contact their surgeon immediately if any of the above or flu-like symptoms develop between now and scheduled procedure.  Per current hospital policy:  One person allowed to accompany/visit.  Masks required.

## 2021-01-05 ENCOUNTER — Other Ambulatory Visit: Admit: 2021-01-05 | Discharge: 2021-01-05 | Payer: MEDICARE

## 2021-01-05 DIAGNOSIS — Z20822 Contact with and (suspected) exposure to covid-19: Secondary | ICD-10-CM

## 2021-01-05 LAB — SARS-COV-2 (COVID-19), SCREENING/ASYMPTOMATIC UNEXPOSED: SARS-CoV-2 (COVID-19) by RT-PCR: NOT DETECTED

## 2021-01-06 LAB — TISSUE EXAM

## 2021-01-06 MED ORDER — lidocaine 20 mg/mL (2 %) injection
20 | INTRAMUSCULAR | Status: DC | PRN
Start: 2021-01-06 — End: 2021-01-06
  Administered 2021-01-06: 16:00:00 20 mg/mL (2 %) via INTRAVENOUS

## 2021-01-06 MED ORDER — bupivacaine liposomal (PF) (EXPAREL) 1.3 % (13.3 mg/mL) injection
1.3 | Status: DC | PRN
Start: 2021-01-06 — End: 2021-01-06
  Administered 2021-01-06: 16:00:00 1.3 % (13.3 mg/mL)

## 2021-01-06 MED ORDER — fentaNYL (PF) (SUBLIMAZE) injection 50-100 mcg
50 | INTRAMUSCULAR | Status: DC | PRN
Start: 2021-01-06 — End: 2021-01-06

## 2021-01-06 MED ORDER — propofoL (DIPRIVAN) injection
10 | INTRAVENOUS | Status: DC | PRN
Start: 2021-01-06 — End: 2021-01-06
  Administered 2021-01-06: 16:00:00 10 mg/mL via INTRAVENOUS

## 2021-01-06 MED ORDER — ondansetron (ZOFRAN) injection 4 mg
4 | Freq: Four times a day (QID) | INTRAMUSCULAR | Status: DC | PRN
Start: 2021-01-06 — End: 2021-01-06

## 2021-01-06 MED ORDER — ondansetron (ZOFRAN) injection
4 | INTRAMUSCULAR | Status: DC | PRN
Start: 2021-01-06 — End: 2021-01-06
  Administered 2021-01-06: 17:00:00 4 mg/2 mL via INTRAVENOUS

## 2021-01-06 MED ORDER — fentaNYL (PF) (SUBLIMAZE) injection
50 | INTRAMUSCULAR | Status: DC | PRN
Start: 2021-01-06 — End: 2021-01-06
  Administered 2021-01-06 (×2): 50 mcg/mL via INTRAVENOUS

## 2021-01-06 MED ORDER — dibucaine (NUPERCAINAL) 1 % ointment
1 | TOPICAL | Status: DC | PRN
Start: 2021-01-06 — End: 2021-01-06
  Administered 2021-01-06: 17:00:00 1 % via TOPICAL

## 2021-01-06 MED ORDER — HYDROcodone-acetaminophen (NORCO) 5-325 mg per tablet
5-325 | ORAL_TABLET | ORAL | 0 refills | 30.00000 days | Status: AC | PRN
Start: 2021-01-06 — End: 2021-01-13

## 2021-01-06 MED ORDER — fentaNYL (PF) (SUBLIMAZE) 50 mcg/mL injection
50 | INTRAMUSCULAR | Status: AC
Start: 2021-01-06 — End: ?

## 2021-01-06 MED ORDER — sodium chloride 0.9% (NS PF) injection
INTRAMUSCULAR | Status: AC
Start: 2021-01-06 — End: ?

## 2021-01-06 MED ORDER — dexAMETHasone (DECADRON) injection
4 | INTRAMUSCULAR | Status: DC | PRN
Start: 2021-01-06 — End: 2021-01-06
  Administered 2021-01-06: 17:00:00 4 mg/mL via INTRAVENOUS

## 2021-01-06 MED ORDER — HYDROmorphone (PF) (DILAUDID) injection 0.5-0.8 mg
0.5 | INTRAMUSCULAR | Status: DC | PRN
Start: 2021-01-06 — End: 2021-01-06

## 2021-01-06 MED ORDER — albuterol (PROVENTIL) nebulizer solution 2.5 mg
2.5 | Freq: Once | RESPIRATORY_TRACT | Status: DC | PRN
Start: 2021-01-06 — End: 2021-01-06

## 2021-01-06 MED ORDER — diphenhydrAMINE (BENADRYL) injection 25 mg
50 | INTRAMUSCULAR | Status: DC | PRN
Start: 2021-01-06 — End: 2021-01-06

## 2021-01-06 MED ORDER — prochlorperazine (COMPAZINE) injection 10 mg
10 | Freq: Four times a day (QID) | INTRAMUSCULAR | Status: DC | PRN
Start: 2021-01-06 — End: 2021-01-06

## 2021-01-06 MED ORDER — bupivacaine liposomal (PF) (EXPAREL) 1.3 % (13.3 mg/mL) injection
1.3 | Status: AC
Start: 2021-01-06 — End: ?

## 2021-01-06 MED ORDER — sodium chloride (bacteriostatic) 0.9 % injection
0.9 | INTRAMUSCULAR | Status: DC | PRN
Start: 2021-01-06 — End: 2021-01-06
  Administered 2021-01-06: 16:00:00 0.9 %

## 2021-01-06 MED ORDER — HYDROmorphone (PF) (DILAUDID) injection 0.2-0.4 mg
0.5 | INTRAMUSCULAR | Status: DC | PRN
Start: 2021-01-06 — End: 2021-01-06

## 2021-01-06 MED ORDER — Lactated Ringers infusion
INTRAVENOUS | Status: DC
Start: 2021-01-06 — End: 2021-01-06
  Administered 2021-01-06: 16:00:00 via INTRAVENOUS

## 2021-01-06 MED ORDER — senna-docusate (PERICOLACE) 8.6-50 mg per tablet
8.6-50 | Freq: Every evening | ORAL | 0.00 refills | 27.50000 days | Status: AC | PRN
Start: 2021-01-06 — End: 2022-01-06

## 2021-01-06 MED ORDER — sodium chloride (NS) 0.9% irrigation
0.9 | Status: DC | PRN
Start: 2021-01-06 — End: 2021-01-06
  Administered 2021-01-06: 17:00:00 0.9 % irrigation

## 2021-01-06 MED ORDER — fentaNYL (PF) (SUBLIMAZE) injection 25-50 mcg
50 | INTRAMUSCULAR | Status: DC | PRN
Start: 2021-01-06 — End: 2021-01-06

## 2021-01-06 MED FILL — FENTANYL (PF) 50 MCG/ML INJECTION SOLUTION: 50 ug/mL | INTRAMUSCULAR | Qty: 2

## 2021-01-06 MED FILL — EXPAREL (PF) 1.3 % (13.3 MG/ML) SUSPENSION FOR LOCAL INFILTRATION: 1.3 % (13.3 mg/mL) | Qty: 20

## 2021-01-06 MED FILL — SODIUM CHLORIDE 0.9 % INJECTION SOLUTION: INTRAMUSCULAR | Qty: 20

## 2021-01-06 NOTE — Op Note (Signed)
Operative Report    Date of Operation: 01/06/2021    Pre-operative diagnosis: Internal and external hemorrhoids    Post-operative diagnosis: Same    Procedure:  1. Anorectal exam under anesthesia  2. Hemorrhoidectomy of right posterior internal/external, right lateral internal, and right anterolateral internal hemorrhoids    Surgeon:  Sheral Flow, MD    Assistant: Verlon Au, [place]    Anesthesia:  GETA    EBL: 15 mL    Complications: None apparent    Specimen: Hemorrhoids    Findings: Right posterolateral external hemorrhoid.  Three column internal hemorrhoids.  Left lateral internal hemorrhoid was small and not excised.     Indication:  Patient presents with symptomatic hemorrhoids with symptoms including pain and bleeding.  She previously trialed a high fiber diet that was not successful resolving symptoms.  She did not tolerate internal banding of her hemorrhoids previously.  A limited exam was performed in the office; however, due to patient discomfort and intolerance, I recommended exam under anesthesia with intention to treat abnormal findings. The patient and I discussed the risks, benefits and alternatives to this procedure. The patient asked questions which I answered and she consented to the procedure.     Details of procedure: After the patient was brought to the OR, a timeout was performed.  SCDs were applied to bilateral lower extremities.  The patient was induced and intubated.  The patient was prepped and draped in the standard sterile fashion after being placed in high lithotomy position.  The anus was inspected. Digital rectal exam was performed. Findings are described above.  The external component of each hemorrhoid was grasped with a clamp.  The hemorrhoid was then excised making sure to protect the underlying internal sphincter muscle.  The hemorrhoidal pedicle was suture ligated with a chromic suture then amputated.  The incision was closed with a running chromic suture to approximate the mucosa  and then epidermis after crossing the anal verge.  The same technique was utilized for the other two columns.  Hemostasis was verified.  Local anesthetic with Experel was injected in a perianal block before and after the procedure.  A gelfoam and topical anesthetic plug was placed.  The patient was positioned in supine, awakened from anesthesia, extubated, and transferred to PACU in stable condition.      Disposition: Home after recovery.    Sheral Flow, MD  General Surgery

## 2021-01-06 NOTE — Discharge Instructions (Signed)
Surgical Procedures for Hemorrhoids, Care After  This sheet gives you information about how to care for yourself after your procedure. Your health care provider may also give you more specific instructions. If you have problems or questions, contact your health care provider.  What can I expect after the procedure?  After the procedure, it is common to have:  ? Rectal pain.  ? Pain when you are having a bowel movement.  ? Slight rectal bleeding. This is more likely to happen with the first bowel movement after surgery.  Follow these instructions at home:  Medicines  ? Take over-the-counter and prescription medicines only as told by your health care provider.  ? If you were prescribed an antibiotic medicine, use it as told by your health care provider. Do not stop using the antibiotic even if your condition improves.  ? Ask your health care provider if the medicine prescribed to you requires you to avoid driving or using heavy machinery.  ? Use a stool softener or a bulk laxative as told by your health care provider.  Eating and drinking  ? Follow instructions from your health care provider about what to eat or drink after your procedure.  ? You may need to take actions to prevent or treat constipation, such as:  ? Drink enough fluid to keep your urine pale yellow.  ? Take over-the-counter or prescription medicines.  ? Eat foods that are high in fiber, such as beans, whole grains, and fresh fruits and vegetables.  ? Limit foods that are high in fat and processed sugars, such as fried or sweet foods.  Activity  ? Rest as told by your health care provider.  ? Avoid sitting for a long time without moving. Get up to take short walks every 1-2 hours. This is important to improve blood flow and breathing. Ask for help if you feel weak or unsteady.  ? Return to your normal activities as told by your health care provider. Ask your health care provider what activities are safe for you.  ? Do not lift anything that is  heavier than 10 lb (4.5 kg), or the limit that you are told, until your health care provider says that it is safe.  ? Do not strain to have a bowel movement.  ? Do not spend a long time sitting on the toilet.    General instructions  ? Take warm sitz baths for 15-20 minutes, 2-3 times a day to relieve soreness or itching and to keep the rectal area clean.  ? Apply ice packs to the area to reduce swelling and pain.  ? Do not drive for 24 hours if you were given a sedative during your procedure.  ? Keep all follow-up visits as told by your health care provider. This is important.    Contact a health care provider if:  ? Your pain medicine is not helping.  ? You have a fever or chills.  ? You have bad smelling drainage.  ? You have a lot of swelling.  ? You become constipated.  ? You have trouble passing urine.  Get help right away if:  ? You have very bad rectal pain.  ? You have heavy bleeding from your rectum.  Summary  ? After the procedure, it is common to have pain and slight rectal bleeding.  ? Take warm sitz baths for 15-20 minutes, 2-3 times a day to relieve soreness or itching and to keep the rectal area clean.  ? Avoid straining   when having a bowel movement.  ? Eat foods that are high in fiber, such as beans, whole grains, and fresh fruits and vegetables.  ? Take over-the-counter and prescription medicines only as told by your health care provider.  This information is not intended to replace advice given to you by your health care provider. Make sure you discuss any questions you have with your health care provider.  Document Revised: 03/13/2019 Document Reviewed: 08/14/2018  Elsevier Patient Education ? 2021 Elsevier Inc.         General Anesthesia, Adult, Care After  This sheet gives you information about how to care for yourself after general anesthesia If you have problems or questions, contact your health care provider.  What can I expect after the procedure?  After the procedure, the following side  effects are common:  ? Nausea and vomiting  ? Sore throat  ? Trouble concentrating  ? Feeling cold or chills  ? Weak or tired, have sleepiness and fatigue  ? Soreness and body aches away from where you had surgery; this is from being on the operating table.  Follow these instructions at home:    For at least 24 hours after the procedure:  ? Have a responsible adult stay with you. It is important to have someone help care for you until you are awake and alert.  ? Do not:  ? Participate in activities in which you could fall or become injured.  ? Drive, or use heavy machinery.  ? Drink alcohol.  ? Take sleeping pills  ? Make important decisions or sign legal documents.  ? Take care of children on your own.  General instructions  ? Drink enough fluid to keep your urine pale yellow.  ? If you vomit, rehydrate by drinking water, juice, or clear broth.  ? If you have sleep apnea, use your CPAP/BiPAP anytime you are sleeping, including daytime naps.  ? While taking prescription pain medicines, sleeping medicines, or medicines that make you drowsy.  ? If you smoke, do not smoke without supervision.

## 2021-01-06 NOTE — OR Nursing (Signed)
Patient AOx4. Vital signs stable. Tolerating prescribed diet and denies nausea. Pain well controlled. Discharge instructions and medications reviewed with patient and family and a hard copy provided. Patient verbalizes understanding. All questions have been answered. Patient discharged to home via wheelchair and CNA.

## 2021-01-06 NOTE — H&P (Signed)
History and Physical      Name: Hannah Mann  DOB: 05/10/1951 70 y.o.  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 ?Patient reports about 38 year history of hemorrhoids following child birth. ?Symptoms include prolapse, bleeding, and intermittent pain associated with inflammation. ?She attempted banding of internal hemorrhoids about 30 years ago, but was syncopal after the procedure so the bands were removed. ?She has tried conservative measures including high fiber diet, bowel regimen, avoiding straining, and preparation H multiple times in the past without relief. ?Bleeding occurs with every bowel movement. ?She only has intermittent constipation. ?Her last colonoscopy was 11/28/18 which showed external hemorrhoids, grade II internal hemorrhoids, diverticula, and two sessile polyps in the ascending colon that were hyperplastic on pathology. ?Denies abdominal pain, weight loss, family history of malignancy.    Past Medical History:  Past Medical History:   Diagnosis Date   . Carcinoma of the skin, basal cell     left leg   . Chronic pain     knees   . Diverticulitis    . Hemorrhoids    . History of colonic polyps    . History of migraine headaches    . Lung nodule    . Osteoarthritis    . Osteopenia    . PONV (postoperative nausea and vomiting)      Past Surgical History:  Past Surgical History:   Procedure Laterality Date   . appendectomy  1980   . CHOLECYSTECTOMY  2002   . COLONOSCOPY     . COLONOSCOPY  2020   . HYSTERECTOMY     . KNEE SURGERY Right 1996    arthroscopy   . MAMMO OUTSIDE FACILITY NO CHARGE  2019   . PROCEDURE Right 08/06/2013    Procedure: THORACOSCOPY; mediastinal lymph node disection; MIDDLE LOBECTOMY;  Surgeon: Johnsie Kindred,  MD;  Location: [facility] OR;  Service: Cardiology;  Laterality: Right;   . PROCEDURE Right 02/23/2015    Procedure: R-TOTAL KNEE ARTHROPLASTY;  Surgeon: Tyna Jaksch, MD;  Location: [facility] OR;  Service: Orthopedics;  Laterality: Right;   . PROCEDURE Left 05/06/2019    Procedure: TOTAL KNEE ARTHROPLASTY;  Surgeon: Tyna Jaksch, MD;  Location: [facility] OR;  Service: Orthopedics;  Laterality: Left;     Current Medications:  Prior to Admission medications    Medication Sig Start Date End Date Taking? Authorizing Provider   polyethylene glycol (MIRALAX) 17 gram packet Take 1 packet by mouth 2 times daily. Take while using narcotic pain medications to prevent constipation.  If loose stools decrease to once a day. 05/01/19   Mayer Masker, NP        Allergies:  Patient has no known allergies.    Family History:  Family History   Problem Relation Age of Onset   . Cancer Mother    . Diabetes Mother    . Cancer Father    . Tobacco Dependence Father        Social History:  Social History     Tobacco Use   . Smoking status: Passive Smoke Exposure - Never Smoker   . Smokeless tobacco: Never Used   Substance Use Topics   . Alcohol use: Yes  Comment: occasional   . Drug use: No       Immunization:    There is no immunization history on file for this patient.    Review of Systems:   General Not Present- Fatigue, None, Persistent Infections, Weight Gain and Weight Loss.  Skin Not Present- Dryness, New Lesions and Rash.  HEENT Present- Seasonal Allergies and Wears glasses/contact lenses. Not Present- Hearing Loss, Oral Ulcers, Sinus Pain and Visual Disturbances.  Respiratory Not Present- Bloody sputum, Chronic Cough, Difficulty Breathing, Sleep apnea and Wheezing.  Cardiovascular Not Present- Chest Pain, Difficulty Breathing On Exertion, Palpitations, Shortness of Breath and Swelling of Extremities.  Gastrointestinal Present- Constipation and Hemorrhoids. Not Present- Abdominal Pain, Bloody Stool, Change in Bowel Habits,  Chronic diarrhea, Excessive gas, Indigestion, Nausea and Vomiting.  Female Genitourinary Not Present- Blood in Urine, Frequency, Incontinence and Pelvic Pain.  Neurological Not Present- Decreased Memory, Difficulty Speaking, Fainting, Headaches, Numbness, Seizures, Trouble walking and Weakness In Extremities.  Psychiatric Not Present- Anxiety, Depression and Suicidal Ideation.  Hematology Not Present- Clotting Problems, Easy Bruising and Excessive bleeding.    Physical Exam:   Vital Signs:  Initial Vitals [01/06/21 0638]   BP (!) 140/59   Pulse 72   Resp 15   Temp 36 ?C (96.8 ?F)   SpO2 97 %       Exam:  General: No distress and well-appearing  Head: Normocephalic, atraumatic  Eyes:  Pupils equal.  EOMI, non injected, no icterus  Nose:  No nasal discharge  Chest: Normal WOB on RA  Cardiac: regular rate and rhythm  Abdomen: Soft, nontender, nondistended.  No rigidity or involuntary guarding.    Extremities: Pink, perfused without edema.  Skin: No rash or jaundice  Psychiatric:  Alert and oriented times four.  Appropriate affect and intact memory.    Data:     Labs:  Lab Results   Component Value Date    WHITEBLOODCE 11.3 (H) 08/07/2013    HGB 11.5 (L) 08/07/2013    HCT 33.4 (L) 08/07/2013    MCV 91.0 08/07/2013    LABPLAT 192 08/07/2013     Lab Results   Component Value Date    NA 139 06/12/2020    K 4.2 06/12/2020    CL 104 06/12/2020    CO2 28.0 06/12/2020    BUN 17 06/12/2020    CREATININES 0.8 06/12/2020    GLU 94 06/12/2020    CALCIUM 9.3 06/12/2020    AST 19 06/12/2020    ALT 15 06/12/2020    ALKPHOS 54 06/12/2020    PROT 7.4 06/12/2020    ALBUMIN 4.5 06/12/2020    BILITOT 0.6 08/05/2013    ANIONGAP 7.0 08/07/2013    LABGLOM >60 08/07/2013    GFRADDINFO  08/07/2013     For African Americans, multiply EGFR x 1.21.  For Children or to adjust for unknown variables, see NKDEP website at https://rodriguez.biz/..    AGRATIO 1.3 08/05/2013        Imaging for last 24 hours:  No results found.      Assessment/Plan:   Ms.  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 ?She has failed conservative measures and banding in the past. ?She is up to date on colonoscopy.      Active Hospital Problems    Hemorrhoids      -OR today for rectal EUA and hemorrhoidectomy  -Consented   -  The procedure has been thoroughly explained to the patient. Generally accepted risks, alternatives and benefits of the procedure have been explained and the patient understands. All questions have been answered and the patient agrees to proceed.       Sheral Flow  01/06/2021  7:15 AM

## 2021-01-06 NOTE — OR Nursing (Signed)
Pt arrived to Phase II from PACU, alert & oriented x4. Vital signs WNL. Pt denies pain and nausea/vomiting at this time.  Tolerating PO. Pt resting comfortably, call light within reach.

## 2021-01-06 NOTE — Anesthesia Post-Procedure Evaluation (Signed)
Patient Name: Hannah Mann  Procedures performed: Procedure(s):  HEMORRHOIDECTOMY; EXAM    Last Vitals:   Vitals Value Taken Time   BP 112/71 01/06/21 0959   Pulse 75 01/06/21 1001   Resp     SpO2 99 % 01/06/21 1001   Temp     Vitals shown include unvalidated device data.    Planned Anesthesia Type: general  Final Anesthesia Type: general  Patients Current Location: PACU  Level of Consciousness: awake, responds appropriatly and sedated  Post Procedure Pain:pain being addressed  Airway: Patent  Respiratory Status: face mask  Cardio Status: hemodynamically stable  Hydration: addequetly hydrated  PONV? NO  no anesthesia complication,               The patient was able to participate in the post op evaluation    Comments:      Karma Ganja, MD  10:02 AM

## 2021-01-06 NOTE — Anesthesia Pre-Procedure Evaluation (Signed)
Anesthesia Evaluation      History of anesthetic complications  PONV   Airway   Mallampati: II  Dental - normal exam   (+) age appropriate    Pulmonary - normal exam    breath sounds clear to auscultation  Cardiovascular - normal exam    Rhythm: regular  Rate: normal    Neuro/Psych    (+) headaches,     GI/Hepatic/Renal      Endo/Other  (+) , cancer    Comments: Lung ca   Musculoskeletal                    Anesthesia Plan    ASA 2     general     intravenous induction         Anesthetic plan, risks and benefits discussed with patient.  Risks discussed included (but were not limited to)   General risks sore throat, respiratory events, drug reaction, nausea, pain and perioprative CV eventsConsenting person understands and agrees to proceed.  Pre-Anesthesia Evaluation Completed at:  01/06/2021 8:57 AM

## 2021-01-06 NOTE — OR Nursing (Signed)
Pt denies pain and nausea, dressings CDI. Pt has ambulated with baseline ability, tolerated PO, has voided, IV removed. Wheeled out by staff with adult driver present for pickup at curb.

## 2021-06-18 ENCOUNTER — Encounter

## 2021-08-11 ENCOUNTER — Ambulatory Visit: Admit: 2021-08-11 | Discharge: 2021-08-15 | Payer: MEDICARE | Attending: Hematology & Oncology

## 2021-08-11 ENCOUNTER — Ambulatory Visit: Admit: 2021-08-11 | Discharge: 2021-08-11 | Payer: MEDICARE

## 2021-08-11 ENCOUNTER — Inpatient Hospital Stay: Admit: 2021-08-11 | Discharge: 2021-08-11 | Payer: MEDICARE

## 2021-08-11 DIAGNOSIS — C3490 Malignant neoplasm of unspecified part of unspecified bronchus or lung: Secondary | ICD-10-CM

## 2021-08-11 LAB — CBC WITH AUTO DIFFERENTIAL
Basophils %: 1 % (ref 0–2)
Basophils, Absolute: 0.1 10*3/??L (ref 0.0–0.2)
Eosinophils %: 2 % (ref 0–7)
Eosinophils, Absolute: 0.1 10*3/??L (ref 0.0–0.7)
HCT: 39 % (ref 37.0–48.0)
Hemoglobin: 13.4 g/dL (ref 12.0–16.0)
Lymphocytes %: 44 % (ref 25–45)
Lymphocytes, Absolute: 3.1 10*3/??L (ref 1.1–4.3)
MCH: 31.3 pg (ref 27.0–34.0)
MCHC: 34.5 g/dL (ref 32.0–36.0)
MCV: 90.7 fL (ref 81.0–99.0)
MPV: 8.2 fL (ref 7.4–10.4)
Monocytes %: 7 % (ref 0–12)
Monocytes, Absolute: 0.5 10*3/??L (ref 0.0–1.2)
Neutrophils %: 47 % (ref 35–70)
Neutrophils, Absolute: 3.3 10*3/??L (ref 1.6–7.3)
Platelet Count: 328 10*3/??L (ref 150–400)
RBC: 4.3 10*6/??L (ref 4.20–5.40)
RDW: 13.6 % (ref 11.5–14.5)
WBC: 7 10*3/??L (ref 4.8–10.8)

## 2021-08-11 LAB — COMPREHENSIVE METABOLIC PANEL
ALT - Alanine Aminotransferase: 19 IU/L (ref 7–52)
AST - Aspartate Aminotransferase: 21 IU/L (ref 8–39)
Albumin/Globulin Ratio: 1.3 (ref >0.9)
Albumin: 4.3 g/dL (ref 3.5–5.0)
Alkaline Phosphatase: 50 IU/L (ref 34–104)
Anion Gap: 9 mmol/L (ref 4–13)
BUN: 16 mg/dL (ref 6–23)
Bilirubin Total: 0.5 mg/dL (ref 0.3–1.2)
CO2 - Carbon Dioxide: 26 mmol/L (ref 21–31)
Calcium: 9.3 mg/dL (ref 8.6–10.3)
Chloride: 105 mmol/L (ref 98–111)
Creatinine: 0.82 mg/dL (ref 0.55–1.10)
GFR Estimated Female: >60 mL/min/{1.73_m2} (ref >=60)
Globulin: 3.2 g/dL (ref 2.2–3.7)
Glucose: 102 mg/dL — ABNORMAL HIGH (ref 80–99)
Potassium: 3.9 mmol/L (ref 3.5–5.1)
Protein Total: 7.5 g/dL (ref 6.0–8.0)
Sodium: 140 mmol/L (ref 135–143)

## 2021-08-11 NOTE — Progress Notes (Signed)
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  She was a patient of my former partner and is transferring care to myself.  She states that over the past year she has been feeling well.  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  She was sent for chest x-ray that showed a 2 cm right middle lobe mass.  She was referred to a pulmonologist and was told to watch it.  However, at 1 point she obtained a new pulmonologist who recommended a biopsy that was done on 01/23/2013.  Pathology showed a low-grade neuroendocrine tumor consistent with carcinoid.  The Ki-67 was less than 1%.  On 08/06/2013 she was taken to surgery and had a right middle lobectomy and lymph node dissection.  Pathology showed a unifocal with multiple small typical carcinoid tumorlets.  The cancer was 2.2 cm in size and low-grade.  5 lymph nodes were resected and were negative for disease.  There was no lymphovascular invasion nor perineural invasion.  Her staging was pT1b N0.  She has been followed with CT scans on a yearly basis.  She had a CT scan of the chest done earlier today that I personally reviewed.  This shows nothing consistent with metastatic disease.  She does have several bilateral small noncalcified pulmonary nodules that are stable.  There is nothing new.  She is breathing well.  She has no complaints of chest pain, shortness of breath, bone pains or abdominal pain.  She has had no fevers, night sweats or unexplained weight loss.  Her bowel movements are good.  She has had no flushing nor diarrhea.    Past Medical History:   Colon  polyps  Diverticulosis  Basal cell skin cancer  Migraine headaches    Past Surgical History:   Appendectomy  Cholecystectomy  Hysterectomy  Right middle lobectomy      Current Outpatient Medications:   .  acetaminophen (TYLENOL) 325 mg tablet, Take 650 mg by mouth every 6 hours as needed for Pain., Disp: , Rfl:   .  polyethylene glycol (MIRALAX) 17 gram packet, Take 1 packet by mouth 2 times daily. Take while using narcotic pain medications to prevent constipation. If loose stools decrease to once a day., Disp: 28 each, Rfl: 0  .  senna-docusate (PERICOLACE) 8.6-50 mg per tablet, Take 2 tablets by mouth at bedtime as needed for Constipation. (Patient not taking: Reported on 08/11/2021), Disp: , Rfl:     No Known Allergies    Social History:   Patient was born in [place], [place].  She moved to the [place] at age 54.  She did not finish high school.  She moved to [place], [place] in 1968.  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  She is retired.  She has been married for 47 years.  She has never smoked but does have secondhand smoke exposure.  The patient does occasionally drink alcohol.  She enjoys gardening, going on  walks and cooking.  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    Family History:   Patient's father had colon cancer.  Her mother had non-Hodgkin lymphoma.    REVIEW OF SYSTEMS      Constitutional: No recent fevers, chills, night sweats, excessive fatigue or weight loss.   Allergic/Immunologic: No reactions  Eyes: No significant visual difficulties. No diplopia. No blind spots.  Ears:  No tinnitus. No hearing loss.  Nose and mouth: No nosebleeds, tooth pain, sore throat, sinus pain, nasal congestion.  Neck: No goiter.  No difficulty swallowing.  Endocrine: No diabetes, thyroid disease or hormone replacement. No hot flashes.   Hematologic/Lymphatic: No easy bruising or bleeding. The patient denies any tender or palpable lymph nodes.  Breasts: No abnormal  masses of breast, nipple discharge or pain.  Respiratory: No wheezing, cough, dyspnea at rest, dyspnea on exertion, chest pain, or hemoptysis.   Cardiovascular: No anginal chest pain, heart murmur, palpitations, varicose veins or orthopnea.  No swelling.  Gastrointestinal: No poor appetite, heartburn, nausea, vomiting, hematemesis, abdominal pain, diarrhea, constipation. No change in bowel habits, hemorrhoids, hernia, rectal bleeding.   Genitourinary (F): No abnormal genital masses. No hematuria, hesitancy, incontinence, vaginal bleeding, discharge or other problems with urination.  No sexually transmitted diseases.  Musculoskeletal: No arthritis, leg pain, back pain or redness. No decreased range of motion   Integumentary : No chronic rashes, inflammation, ulcerations or skin changes.  No changes in nevi.  Neurologic: No convulsions, dizziness, blackouts, weakness, stroke, headaches or numbness.   Psychiatric: No insomnia, anxiety, depression, mania or mood swings.      PHYSICAL EXAM    Vitals:    08/11/21 1536   BP: 130/72   Pulse: 93   Resp: 16   Temp: 36.4 ?C (97.6 ?F)   SpO2: 97%        Constitutional:  Well appearing.  In no apparent distress.  Appears close to chronologic age.  Head: Normocephalic.  Atraumatic.  Eyes : Conjunctivae and sclerae are clear and without icterus. Pupils are equal.  ENMT : Sinuses non-tender.    Neck : Supple without masses.  Lymphatic : No tender or palpable cervical, supraclavicular, axillary or inguinal lymphadenopathy.  Chest:  Symmetric.  No masses.  Respiratory: Lungs are clear to auscultation and percussion without crackles, wheezing, or rhonchi.   Cardiovascular: Regular rate and rhythm of heart without murmurs, rubs or gallops.  Abdomen: Positive bowel sounds, non-distended, soft, non-tender, no masses. No guarding or rebound tenderness. No hepatomegaly or splenomegaly.  Back/Spine: No kyphosis or scoliosis. Non-tender to palpation.  Musculoskeletal: No tenderness or  swelling, normal range of motion without obvious weakness.   Extremities: No visible deformities, no cyanosis, clubbing or edema.  lntegumentary: No petechiae, erythema, rashes or ecchymoses.  Neurologic : Cranial nerves intact.  No motor deficits.  Normal cerebellar function.  Normal gait.  Psychiatric: Alert and oriented times three. Coherent speech. Verbalizes understanding of our discussions today.    LABS  Outpatient Lab on 08/11/2021   Component Date Value   . WBC 08/11/2021 7.0    . RBC 08/11/2021 4.30    . Hemoglobin 08/11/2021 13.4    . HCT 08/11/2021 39.0    . MCV 08/11/2021 90.7    . [facility] 08/11/2021 31.3    . MCHC 08/11/2021 34.5    . RDW 08/11/2021 13.6    . Platelet Count 08/11/2021 328    . MPV 08/11/2021 8.2    . Neutrophils % 08/11/2021 47    .  Lymphocytes % 08/11/2021 44    . Monocytes % 08/11/2021 7    . Eosinophils % 08/11/2021 2    . Basophils % 08/11/2021 1    . Neutrophils, Absolute 08/11/2021 3.3    . Lymphocytes, Absolute 08/11/2021 3.1    . Monocytes, Absolute 08/11/2021 0.5    . Eosinophils, Absolute 08/11/2021 0.1    . Basophils, Absolute 08/11/2021 0.1    . Differential Type 08/11/2021 Automated Differential    . Sodium 08/11/2021 140    . Potassium 08/11/2021 3.9    . Chloride 08/11/2021 105    . CO2 - Carbon Dioxide 08/11/2021 26    . Glucose 08/11/2021 102 (A)   . BUN 08/11/2021 16    . Creatinine 08/11/2021 0.82    . Calcium 08/11/2021 9.3    . AST - Aspartate Aminotra* 08/11/2021 21    . ALT - Alanine Aminotrans* 08/11/2021 19    . Alkaline Phosphatase 08/11/2021 50    . Bilirubin Total 08/11/2021 0.5    . Protein Total 08/11/2021 7.5    . Albumin 08/11/2021 4.3    . Globulin 08/11/2021 3.2    . Albumin/Globulin Ratio 08/11/2021 1.3    . Anion Gap 08/11/2021 9    . GFR Estimated Female 08/11/2021 >60    . GFR Additional Info 08/11/2021            IMAGING  CT chest without contrast    Result Date: 08/11/2021  CT CHEST WO 08/11/2021 1:55 PM PROVIDED CLINICAL INDICATIONS:  hx of lung  carcinoid, annual surveillance Malignant neoplasm of unspecified part of unspecified bronchus or lung (HCC) ADDITIONAL CLINICAL HISTORY:  None. COMPARISON:  06/12/2020 CT. TECHNIQUE:  Computed tomography (CT) imaging of the chest is performed. No intravenous contrast is administered. Dose reduction techniques were used including but not limited to automated exposure control (AEC), iterative reconstruction technique, and/or mA and/or kV dose adjustments based on patient size. FINDINGS: LOWER NECK:  Normal. No enlarged lymph nodes. CHEST WALL/AXILLA:  Normal. No enlarged lymph nodes. MEDIASTINUM:  Postoperative changes are seen secondary to prior right middle lobectomy. No enlarged lymph nodes. LUNGS/PLEURA: The patient is status post right middle lobectomy. There is a stable 4 mm noncalcified pulmonary nodule in the left lower lobe (series 3 image 27/62). An additional stable noncalcified pulmonary nodule in the left lower lobe measures 3 mm (series 3 image 25/62).  There is a stable 3-4 mm noncalcified pulmonary nodule at the right lower lobe (series 3 image 29/62). There are 2 contiguous 3-4 mm noncalcified pulmonary nodules in the right lower lobe (series 3 image 30/62), stable compared to the prior. No new pulmonary nodules. UPPER ABDOMEN:  The patient is status post cholecystectomy. BONES:  Multilevel mild intervertebral disc space disease is present in the thoracic spine. OTHER:  None.     IMPRESSION: 1.  The patient is status post right middle lobectomy. 2.  Several bilateral small noncalcified pulmonary nodules are stable compared to the prior. No new pulmonary nodules.         Ongoing Care Management  ECOG: Grade 0-5: 0 - Fully active, able to carry on all pre-disease performance without restriction  Pain level: Pain Score: 0-No pain   Depression Screen:        Patient demonstrated comprehension of the information provided regarding their disease and treatment plan and was afforded the opportunity to ask  questions about the diagnosis and treatment options.      ASSESSMENT AND PLAN:  1.  Neuroendocrine  tumor: Ms. Gehling is a 70 year old woman with history of a low-grade neuroendocrine tumor of the right lung.  She had a right lower lobectomy in October 2014 and since that time has done well.  The cancer was slightly greater than 2 cm in size and had a Ki-67 of less than 1%.  She has been followed on a yearly basis with CT scan.  I explained to the patient that this kind of cancer can come back in the liver and therefore I would recommend that we have her follow-up in 1 year with a CT scan of the chest and also of the liver with liver protocol.  This is a slow-growing cancer and she has no complaints of carcinoid syndrome type symptoms.  If the cancer were to recur, surgery would be the best option for treatment.  At this point she appears to be without evidence of disease and overall is doing very well.  We will see her back in about 11 months time in order to keep her out of the winter so that she can drive safely.    2.  Lung nodules: Patient does have multiple small lung nodules bilaterally.  All of these are less than 5 mm in size.  It is possible that these could represent slow-growing neuroendocrine cancers.  However, this is not exactly clear.  As they have not changed I do not feel that we need to be aggressive working these up.  These would be too small to biopsy and we would have to do surgery in order to evaluate them.  That is not reasonable at this time.    3.  Advance directive: Patient was asked about advanced directive.  She does not currently have an advanced directive.  This is something that she should discuss with her primary care provider in the future.    I spent 24 minutes in face-to-face time with patient, 9 minutes reviewing records, 11 minutes in documentation and 3 minutes placing orders.       Sansa Alkema T. Tyah Acord, MD    Copy: Shanda Bumps      We retain the right to modify this information  without notice in the event of errors. Documentation was via Dragon Transcription program and occasional errors in punctuation, grammar, and content may occur.   The patient has been advised to follow up with appointments. Patient has been informed that non-compliance with this recommendation could result in serious consequences because of delays in diagnosis and treatment. It also has been disclosed to the patient that we expect patients to take an active role in their health care and never assume that no news is good news.

## 2021-09-20 IMAGING — CT TC ABDOME TOTAL COM CO
2 of 5 series · 14 of 32 positions shown, 19 images · non-contrast
Comparison: none

[Series 3: sem contraste 1.25mm · axial · 0.90mm/px · z∈[-426,-129]mm · 6 of 429 slices shown]
[im 48/429  soft-tissue]
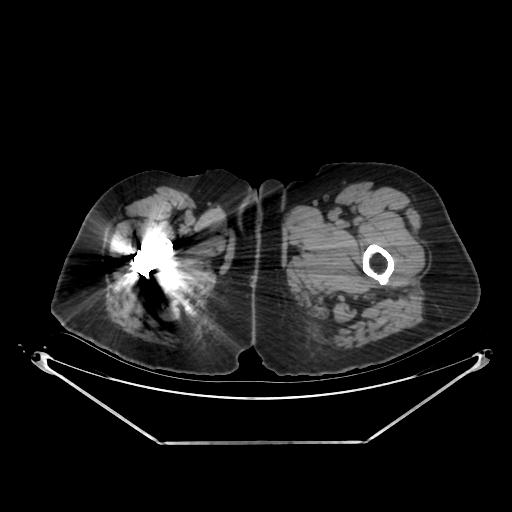
[im 96/429  soft-tissue]
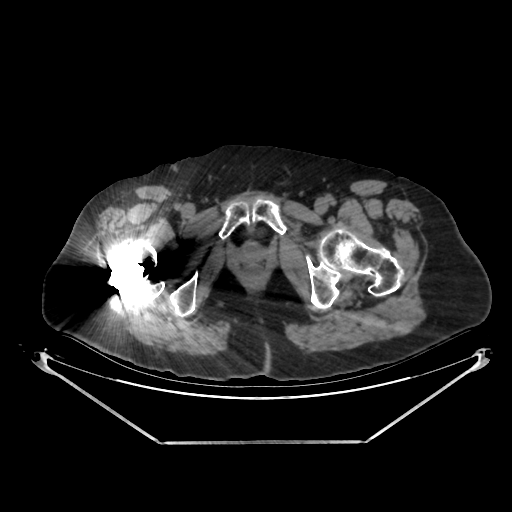
[im 143/429  soft-tissue]
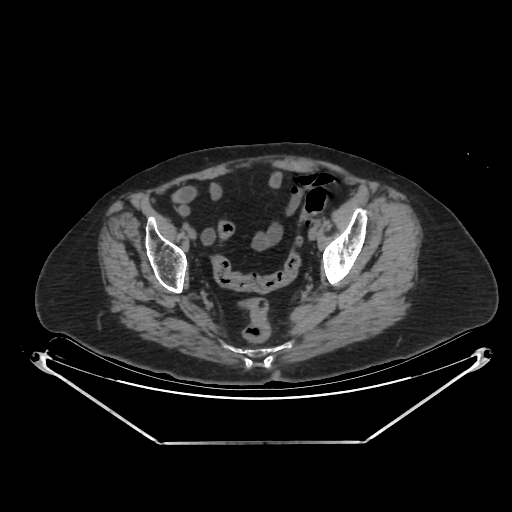
[im 191/429  soft-tissue]
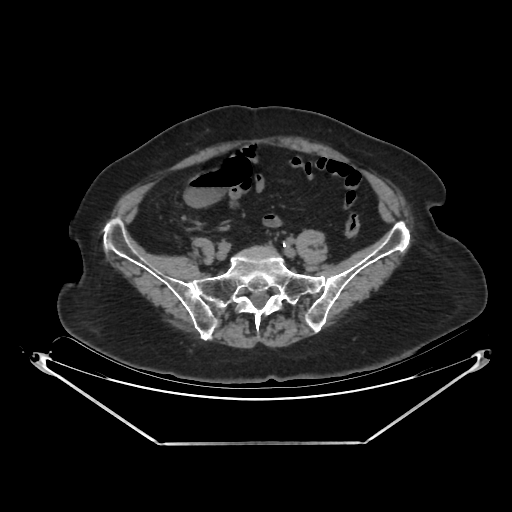
[im 238/429  soft-tissue]
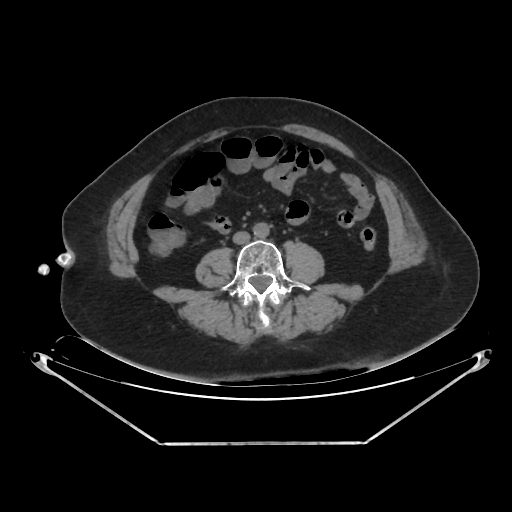
[im 286/429  soft-tissue]
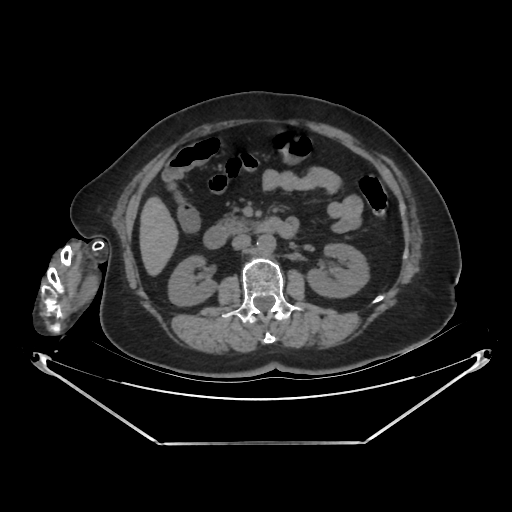

[Series 6: portal · axial · portal-venous · 0.90mm/px · z∈[-426,-10]mm · 8 of 429 slices shown, 13 images]
[im 48/429  soft-tissue]
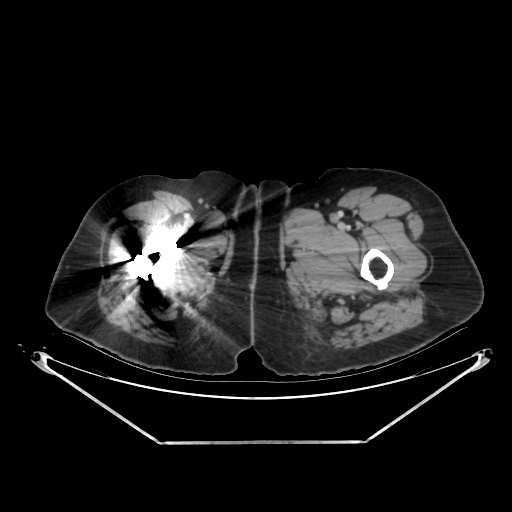
[im 48/429  bone]
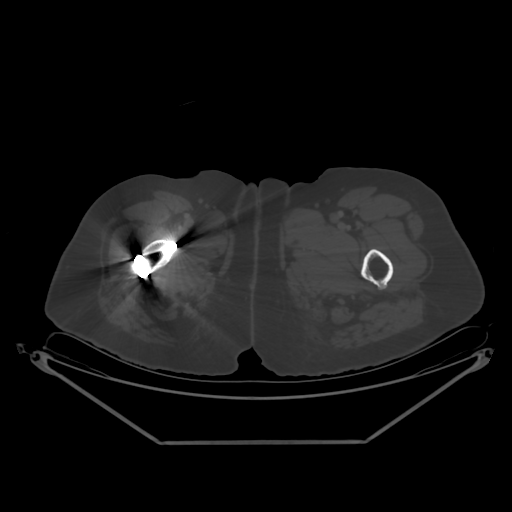
[im 96/429  soft-tissue]
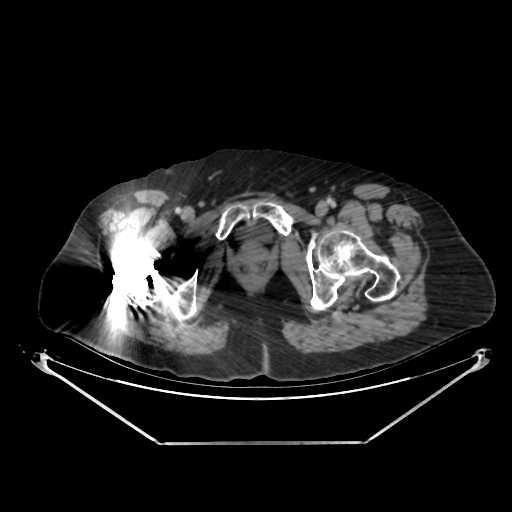
[im 143/429  soft-tissue]
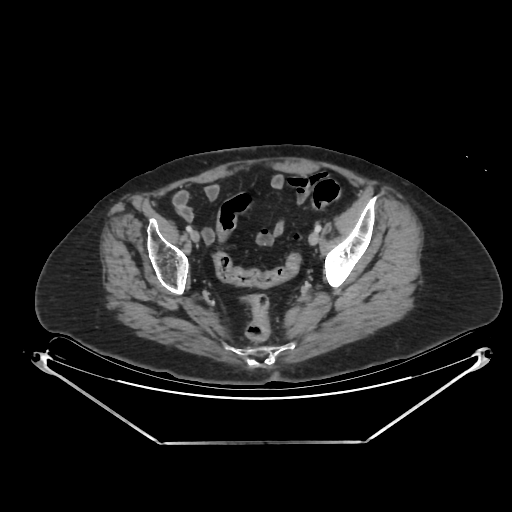
[im 191/429  soft-tissue]
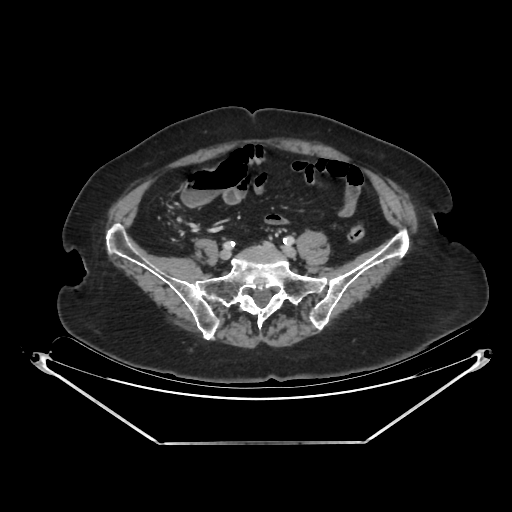
[im 238/429  soft-tissue]
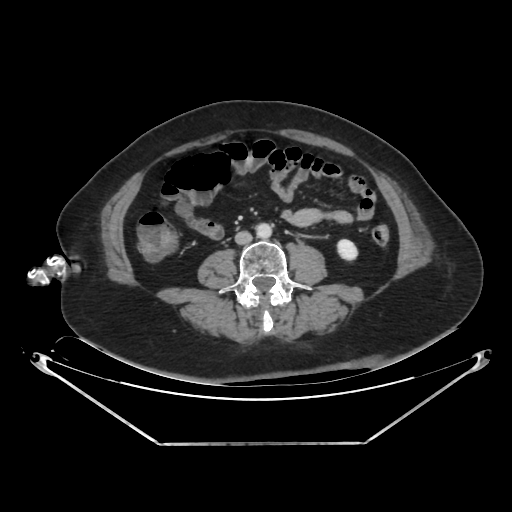
[im 238/429  lung]
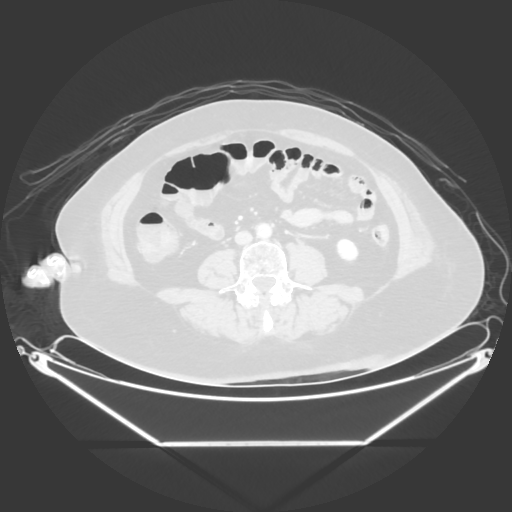
[im 286/429  soft-tissue]
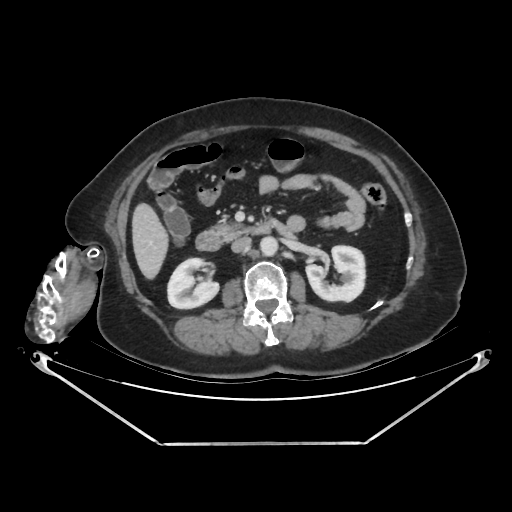
[im 286/429  lung]
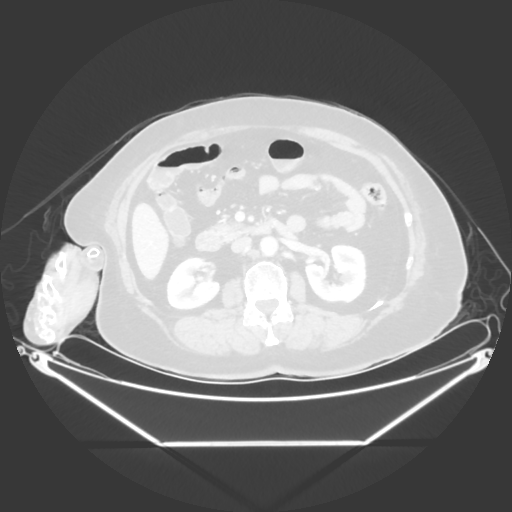
[im 333/429  soft-tissue]
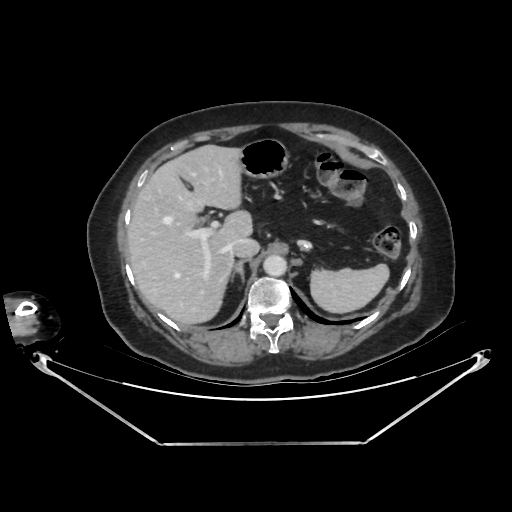
[im 333/429  lung]
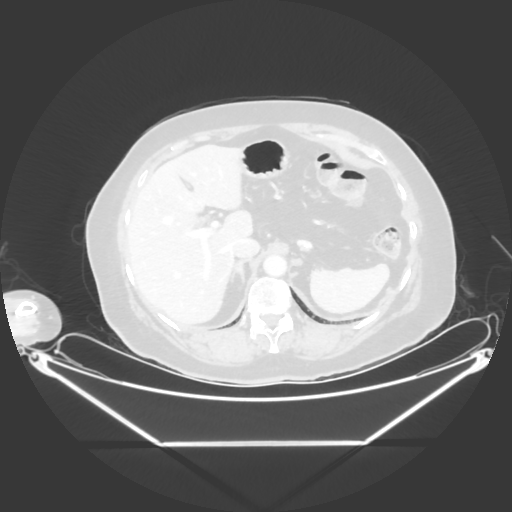
[im 381/429  soft-tissue]
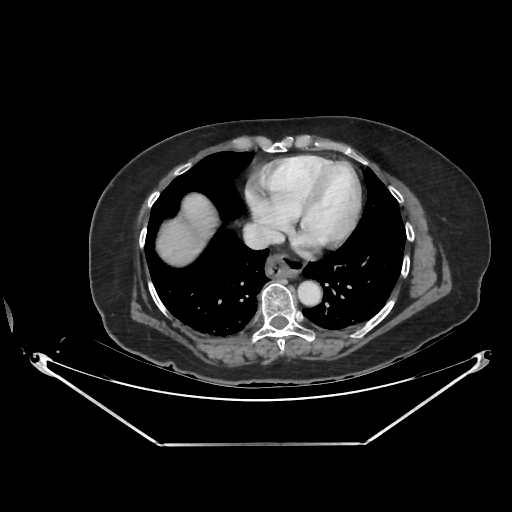
[im 381/429  lung]
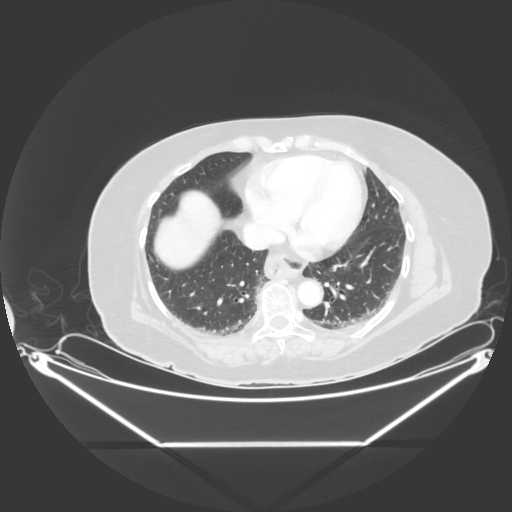

[14 of 32 positions shown; findings below may reference images not displayed]

TOMOGRAFIA COMPUTADORIZADA DO ABDOME E PELVE

TÉCNICA:
Exame realizado em aparelho de tomografia computadorizada, com colimação, filtros e reconstruções específicas para o segmento de interesse, antes e após a administração endovenosa do meio de contraste. Previamente ao exame não foi realizada opacificação do trato gastrointestinal.

RESULTADO:
Hérnia de hiato gastroesofágico
Fígado de dimensões, contorno e coeficientes de atenuação habituais.
Não há sinais de dilatação das vias biliares intra ou extra-hepáticas.
Pâncreas com dimensões, contorno e densidade normais.
Baço de densidade homogênea e dimensões anatômicas.
Adrenais de formas, densidades e dimensões normais.
Rins de contornos e dimensões normais, concentrando o meio de contraste simetricamente. Não há sinais de dilatação pielocalicinal ou de litíase. 
Ausência de imagens compatíveis com linfonodomegalias.
Aorta de calibre e contornos normais, apresentando placas calcificadas
Bexiga com boa capacidade e contornos regulares.
Não há evidências de líquido livre na cavidade peritoneal.
Não há evidências de processo expansivo pélvico.

CONCLUSÃO:
Hérnia de hiato gastroesofágico
Ateromatose aórtica

## 2022-07-19 ENCOUNTER — Ambulatory Visit: Admit: 2022-07-19 | Discharge: 2022-07-19 | Payer: MEDICARE

## 2022-07-19 ENCOUNTER — Ambulatory Visit: Admit: 2022-07-19 | Payer: MEDICARE | Attending: Hematology & Oncology

## 2022-07-19 ENCOUNTER — Inpatient Hospital Stay: Admit: 2022-07-19 | Discharge: 2022-07-19 | Payer: MEDICARE | Attending: Hematology & Oncology

## 2022-07-19 DIAGNOSIS — C3490 Malignant neoplasm of unspecified part of unspecified bronchus or lung: Secondary | ICD-10-CM

## 2022-07-19 LAB — COMPREHENSIVE METABOLIC PANEL
ALT - Alanine Aminotransferase: 19 IU/L (ref 7–52)
AST - Aspartate Aminotransferase: 18 IU/L (ref 8–39)
Albumin/Globulin Ratio: 1.5 (ref >0.9)
Albumin: 4.5 g/dL (ref 3.5–5.0)
Alkaline Phosphatase: 50 IU/L (ref 34–104)
Anion Gap: 9 mmol/L (ref 4–13)
BUN: 11 mg/dL (ref 6–23)
Bilirubin Total: 0.4 mg/dL (ref 0.3–1.2)
CO2 - Carbon Dioxide: 27 mmol/L (ref 21–31)
Calcium: 9.2 mg/dL (ref 8.6–10.3)
Chloride: 104 mmol/L (ref 98–111)
Creatinine: 0.7 mg/dL (ref 0.55–1.10)
Globulin: 3.1 g/dL (ref 2.0–3.7)
Glomerular Filtration Rate Estimate (Female): >60 mL/min/{1.73_m2} (ref >=60)
Glucose: 97 mg/dL (ref 80–99)
Potassium: 4.3 mmol/L (ref 3.5–5.1)
Protein Total: 7.6 g/dL (ref 6.0–8.0)
Sodium: 140 mmol/L (ref 135–143)

## 2022-07-19 LAB — CBC WITH AUTO DIFFERENTIAL
Basophils %: 1 % (ref 0–2)
Basophils, Absolute: 0 10*3/ÂµL (ref 0.0–0.2)
Eosinophils %: 2 % (ref 0–7)
Eosinophils, Absolute: 0.1 10*3/ÂµL (ref 0.0–0.7)
HCT: 40.2 % (ref 37.0–48.0)
Hemoglobin: 13.8 g/dL (ref 12.0–16.0)
Lymphocytes %: 43 % (ref 25–45)
Lymphocytes, Absolute: 3.7 10*3/ÂµL (ref 1.1–4.3)
MCH: 31.3 pg (ref 27.0–34.0)
MCHC: 34.3 g/dL (ref 32.0–36.0)
MCV: 91.3 fL (ref 81.0–99.0)
MPV: 8.3 fL (ref 7.4–10.4)
Monocytes %: 6 % (ref 0–12)
Monocytes, Absolute: 0.5 10*3/ÂµL (ref 0.0–1.2)
Neutrophils %: 50 % (ref 35–70)
Neutrophils, Absolute: 4.3 10*3/ÂµL (ref 1.6–7.3)
Platelet Count: 280 10*3/ÂµL (ref 150–400)
RBC: 4.41 10*6/ÂµL (ref 4.20–5.40)
RDW: 13.5 % (ref 11.5–14.5)
WBC: 8.7 10*3/ÂµL (ref 4.8–10.8)

## 2022-07-19 MED ORDER — iopamidoL (ISOVUE-370) 370 mg iodine /mL (76 %) injection 80 mL
370 | Freq: Once | INTRAVENOUS | Status: AC | PRN
Start: 2022-07-19 — End: 2022-07-19
  Administered 2022-07-19: 19:00:00 370 mL via INTRAVENOUS

## 2022-07-19 NOTE — Progress Notes (Signed)
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  This was resected in 2014.  She states that over the past year she has been feeling very well.  She states that she has not had any headaches, vision changes, chest pain, shortness of breath, abdominal pain, or new bone pain.  She does have some soreness on her lower back/right hip.  She states that she has been following with orthopedics for this.  She denies any infections over the past year.  She has not had any fevers, night sweats, or unexplained weight loss.  She denies any flushing or diarrhea.  Bowel movements have been okay.  She did have a sun spot removed from the top right of her nose.  This came back benign.  She does have scattered petechiae on her chest.  This was observed by the dermatologist as well.  She states that these have been there for years.  She continues to be active with daily walking, gardening, and watching her new 59-month-old great granddaughter.  Patient had a CT scan of the chest and abdomen completed today.  This has not been read out yet.    Past Medical History:   Diagnosis Date   . Carcinoma of the skin, basal cell     left leg   . Chronic pain     knees   . Diverticulitis    . Hemorrhoids    . History of colonic polyps    . History of migraine headaches    . Lung nodule    . Osteoarthritis    . Osteopenia    . PONV (postoperative nausea and vomiting)         Social History     Tobacco Use   Smoking Status Never   . Passive exposure: Yes   Smokeless Tobacco Never           Current Outpatient Medications:   .  acetaminophen (TYLENOL) 325 mg tablet, Take 650 mg by  mouth every 6 hours as needed for Pain., Disp: , Rfl:   .  polyethylene glycol (MIRALAX) 17 gram packet, Take 1 packet by mouth 2 times daily. Take while using narcotic pain medications to prevent constipation. If loose stools decrease to once a day. (Patient not taking: Reported on 07/19/2022), Disp: 28 each, Rfl: 0  No current facility-administered medications for this visit.     Review of systems:    As above.  There are no additional skin, blood, ear, eye, head, neck, lymph node, cardiovascular, respiratory, GI, GU, musculoskeletal, neurologic or psychiatric complaints.     Vitals:    07/19/22 1457   BP: 128/78   Pulse: 71   Resp: 16   Temp: 36.4 ?C (97.6 ?F)   SpO2: 97%        Physical Examination:     Constitutional:  Well appearing.  In no apparent distress.  Appears close to chronologic age.  Head: Normocephalic.  Atraumatic.  Eyes : Conjunctivae and sclerae are clear and without icterus. Pupils are equal.  ENMT : Sinuses non-tender.  No oral exudates, ulcers, masses, thrush or mucositis. Oropharynx clear. Tongue normal.   Neck : Supple without masses.   Lymphatic : No tender or palpable cervical, supraclavicular, axillary or inguinal lymphadenopathy.  Chest:  Symmetric.  No masses.  Respiratory: Lungs are clear to auscultation and percussion without crackles, wheezing, or rhonchi.   Cardiovascular: Regular rate and rhythm of heart without murmurs, rubs or gallops.  Abdomen: Positive bowel sounds, non-distended, soft, non-tender, no masses. No guarding or rebound tenderness. No hepatomegaly or splenomegaly.  Back/Spine: No kyphosis or scoliosis. Non-tender to palpation.  Extremities: No visible deformities, no cyanosis, clubbing or edema.  lntegumentary: No erythema, rashes or ecchymoses. Scattered petechiae on chest.   Neurologic : Cranial nerves intact.  No motor deficits.  Normal cerebellar function.  Normal gait.  Psychiatric: Alert and oriented times three. Coherent speech. Verbalizes understanding of  our discussions today.    Labs:   Outpatient Lab on 07/19/2022   Component Date Value   . Sodium 07/19/2022 140    . Potassium 07/19/2022 4.3    . Chloride 07/19/2022 104    . CO2 - Carbon Dioxide 07/19/2022 27    . Glucose 07/19/2022 97    . BUN 07/19/2022 11    . Creatinine 07/19/2022 0.70    . Calcium 07/19/2022 9.2    . AST - Aspartate Aminotra* 07/19/2022 18    . ALT - Alanine Aminotrans* 07/19/2022 19    . Alkaline Phosphatase 07/19/2022 50    . Bilirubin Total 07/19/2022 0.4    . Protein Total 07/19/2022 7.6    . Albumin 07/19/2022 4.5    . Globulin 07/19/2022 3.1    . Albumin/Globulin Ratio 07/19/2022 1.5    . Anion Gap 07/19/2022 9    . Glomerular Filtration Ra* 07/19/2022 >60    . GFR Additional Info 07/19/2022     . WBC 07/19/2022 8.7    . RBC 07/19/2022 4.41    . Hemoglobin 07/19/2022 13.8    . HCT 07/19/2022 40.2    . MCV 07/19/2022 91.3    . MCH 07/19/2022 31.3    . MCHC 07/19/2022 34.3    . RDW 07/19/2022 13.5    . Platelet Count 07/19/2022 280    . MPV 07/19/2022 8.3    . Neutrophils % 07/19/2022 50    . Lymphocytes % 07/19/2022 43    . Monocytes % 07/19/2022 6    . Eosinophils % 07/19/2022 2    . Basophils % 07/19/2022 1    . Neutrophils, Absolute 07/19/2022 4.3    . Lymphocytes, Absolute 07/19/2022 3.7    . Monocytes, Absolute 07/19/2022 0.5    . Eosinophils, Absolute 07/19/2022 0.1    . Basophils, Absolute 07/19/2022 0.0    . Differential Type 07/19/2022 Automated Differential        Imaging:  No results found.      Ongoing Care Management  ECOG: Grade 0-5: 0 - Fully active, able to carry on all pre-disease performance without restriction  Pain level: Pain Score: 0-No pain  Depression Screen:        Patient demonstrated comprehension of the information provided regarding their disease and treatment plan and was afforded the opportunity to ask questions about the diagnosis and treatment options.      Assessment & Plan:  1.  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  She had a right lower  lobectomy in October 2014.  Since then, she has done well.  The cancer was slightly greater than 2 cm in size and had a Ki-67 of less than 1%.  Since then, she has been followed on a yearly basis with CT scan.  This disease can come back in the liver, therefore she received a CT scan of the chest and also of the liver with liver protocol today.  This is a slow-growing and she does not have any complaints of carcinoid syndrome symptoms.  CT scan today has not been read out yet.  I personally reviewed the film myself.  Few pulmonary nodules noted.  No disease in the liver noted. It appears she is without evidence of disease.  Any changes on the read out will be addressed.  Next year, will be 10 years since diagnosis.  At this time we will assess if she needs to continue being followed by [place].  We will see her back in 1 year with laboratories and a CT scan of the chest and also of the liver.  If anything changes, she will let us know, otherwise we will see her back in 1 year as planned.    2.  Lung nodules: The patient does have multiple small lung nodules bilaterally.  The CT scan from last year showed that these are all less than 5 mm in size.  CT scan completed today has not been read out at this time.  I reviewed the film myself.  It appears that these have not changed significantly in size.  It is possible these could represent slow-growing or neuroendocrine cancers.  However the cause is not exactly clear.  We will continue to monitor these with another scan next year.      Hannah Schooner, DNP, FNP-BC     Copy: Shanda Bumps    Addendum: I saw the patient today with Hannah Mann.  I agree with the above documentation.  At this point, she is still doing very well and is without evidence of disease.  She is nearly a decade out.  She has done quite well.  We will see her back in 1 years time with laboratories and a CT before  that visit.  If she is still doing well at that time without evidence of recurrence, we might discharge her from clinic.  Total time for this visit today was 31 minutes.    Hannah Gruenberg T. Zakayla Martinec, MD    We retain the right to modify this information without notice in the event of errors. Documentation was via Dragon Transcription program and occasional errors in punctuation, grammar, and content may occur.  The patient has been advised to follow up with appointments. Patient has been informed that non-compliance with this recommendation could result in serious consequences because of delays in diagnosis and treatment. It also has been disclosed to the patient that we expect patients to take an active role in their health care and never assume that no news is good news.

## 2022-07-28 NOTE — Telephone Encounter (Signed)
Lab orders entered

## 2023-06-01 IMAGING — CT TC CRANIO SEM CONTRAST
1 of 2 series · 13 of 30 positions shown, 17 images · non-contrast
Comparison: none

[Series 2: cranio · axial · 0.49mm/px · z∈[-89,+56]mm · 13 of 136 slices shown, 17 images]
[im 10/136  brain]
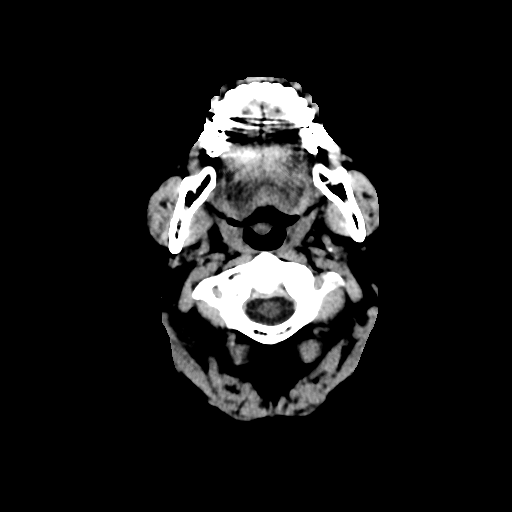
[im 10/136  bone]
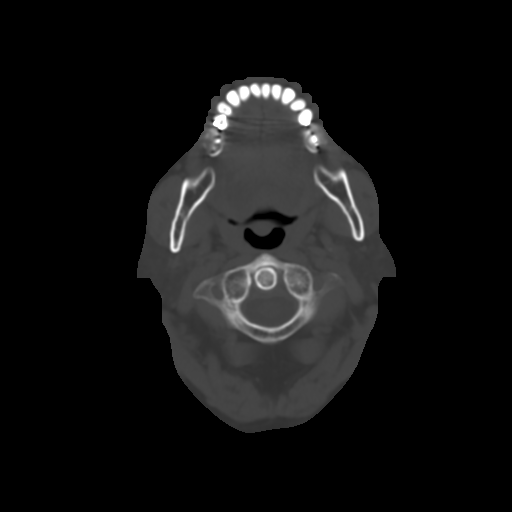
[im 20/136  brain]
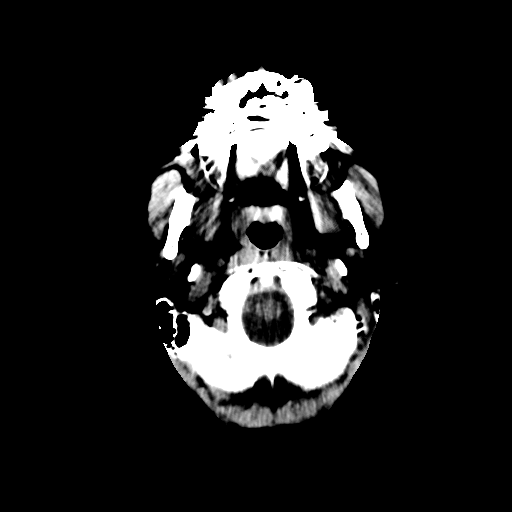
[im 29/136  brain]
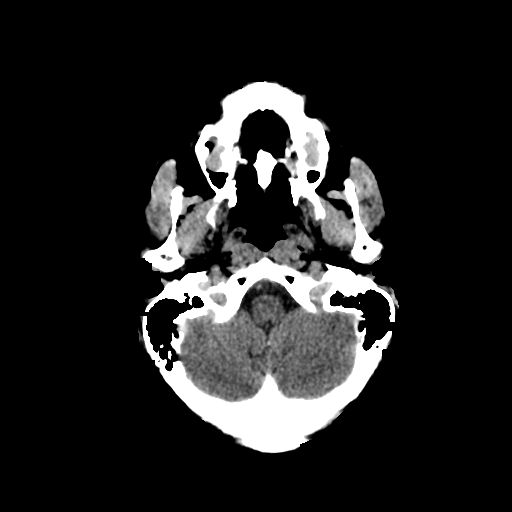
[im 39/136  brain]
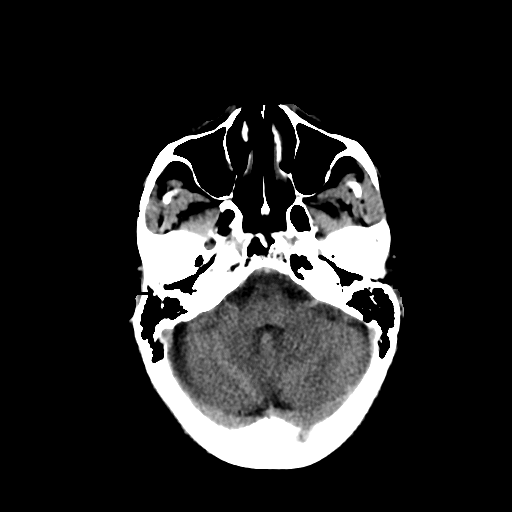
[im 49/136  brain]
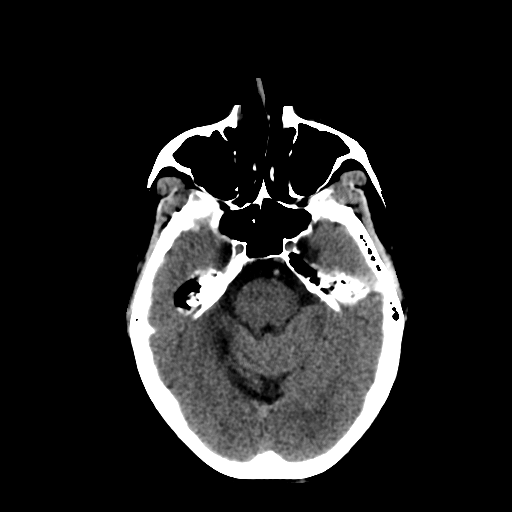
[im 49/136  bone]
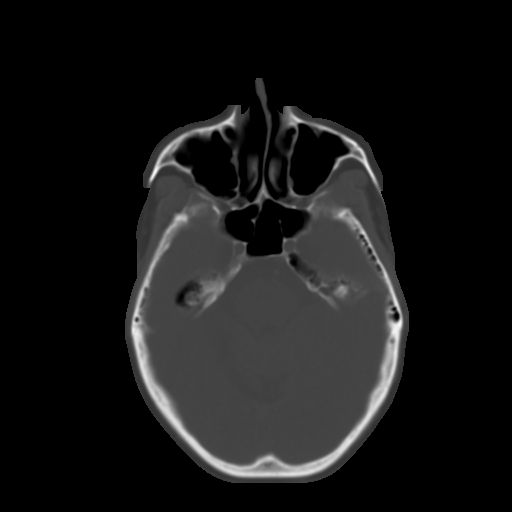
[im 58/136  brain]
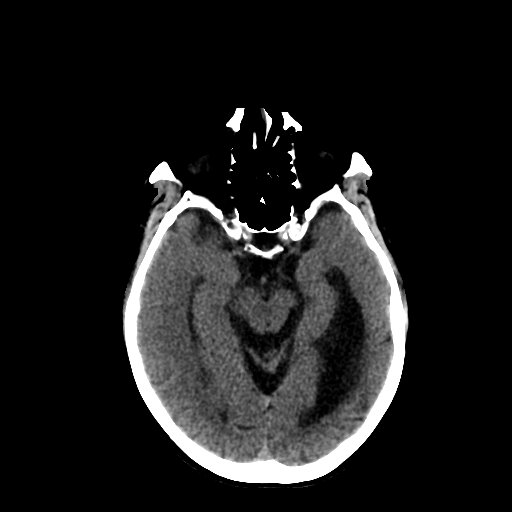
[im 68/136  brain]
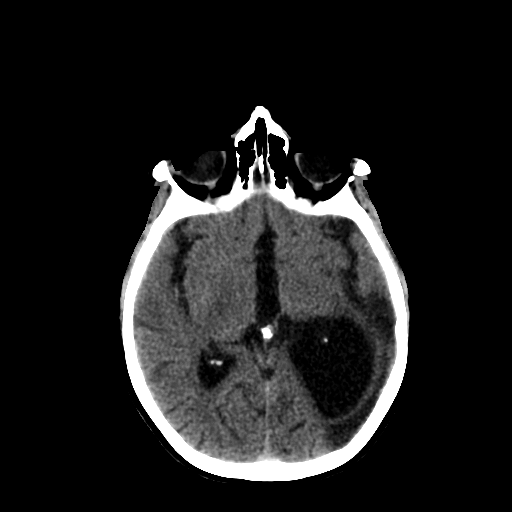
[im 78/136  brain]
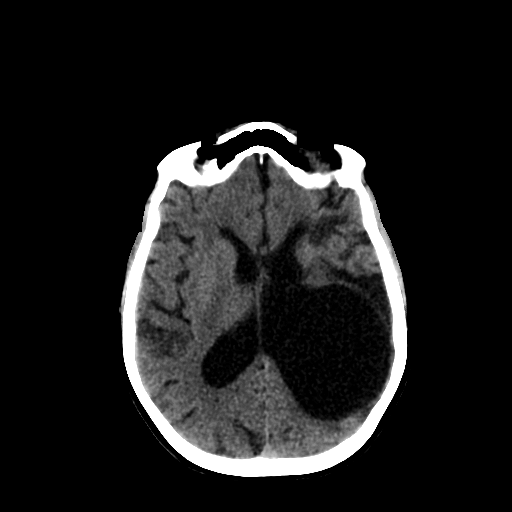
[im 87/136  brain]
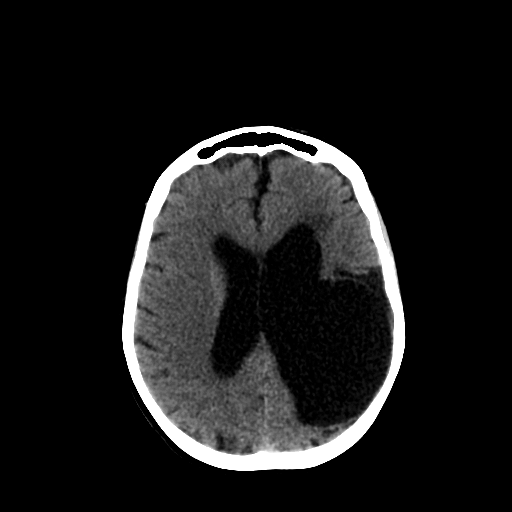
[im 87/136  bone]
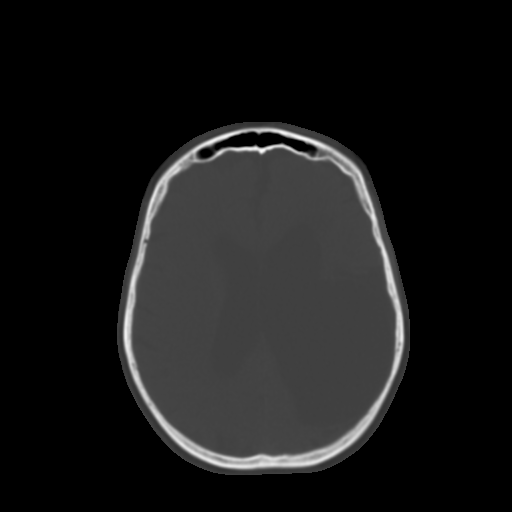
[im 97/136  brain]
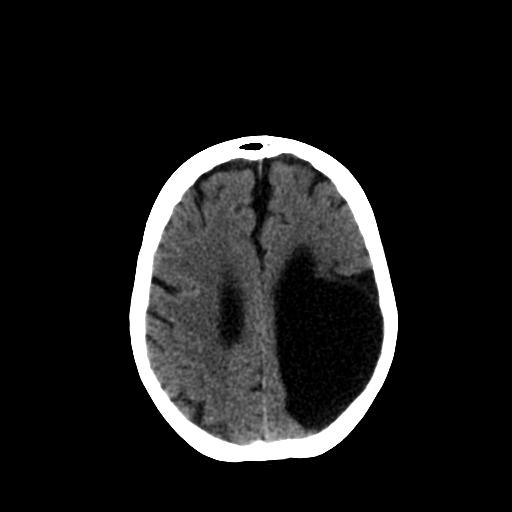
[im 107/136  brain]
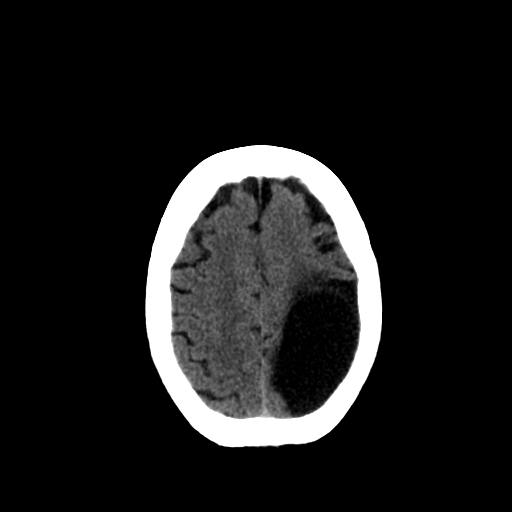
[im 116/136  brain]
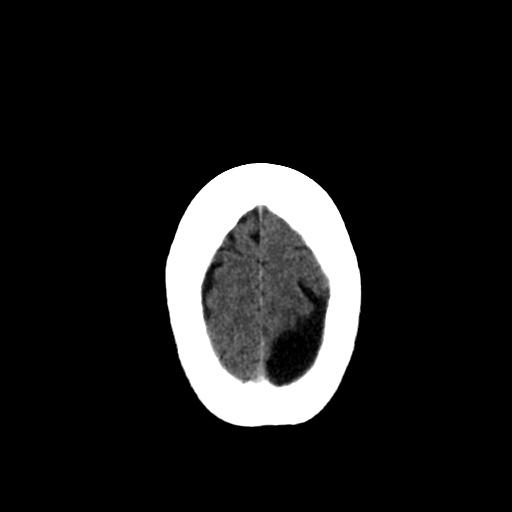
[im 126/136  brain]
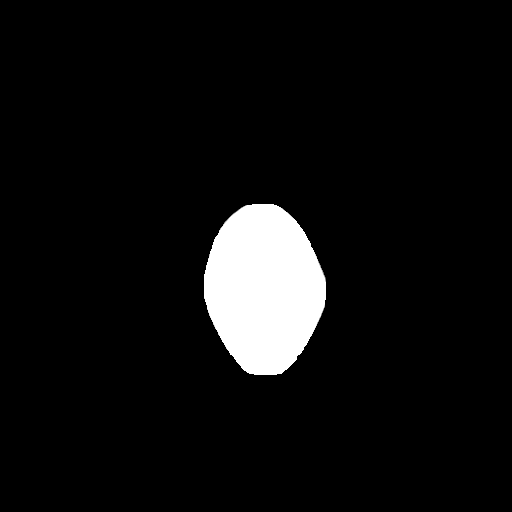
[im 126/136  bone]
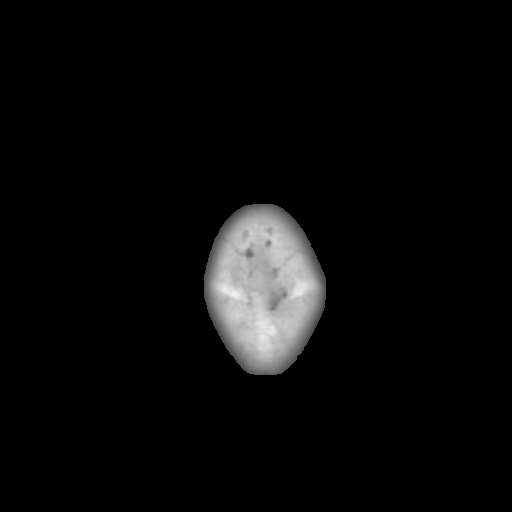

[13 of 30 positions shown; findings below may reference images not displayed]

TOMOGRAFIA COMPUTADORIZADA DO CRÂNIO

TÉCNICA:
Exame realizado em aparelho de tomografia computadorizada, com colimação, filtros e reconstruções específicas para o segmento de interesse, sem a administração endovenosa do meio de contraste.

RESULTADO:

Área de encefalomalácia parietal à esquerda, com extensão parcial para os lobos frontal, temporal e occipital adjacentes, determinando acentuação dos sulcos corticais e cissuras regionais e acentuada dilatação retrátil do ventrículo lateral esquerdo, devendo corresponder a sequela de insulto isquêmico / porencefalia.
Discreta redução volumétrica do pedúnculo cerebral esquerdo compatível com degeneração walleriana.
Outra pequena área de encefalomalácia córtico / subcortical frontal basal à esquerda, determinando acentuação dos sulcos corticais regionais, também provavelmente relacionada à sequela de insulto isquêmico.
Hipodensidade mal definida na substância branca supratentorial periventricular. Provável microangiopatia.
Redução volumétrica encefálica difusa com dilatação do espaço subaracnoideo.
Ausência de hidrocefalia hipertensiva.
Estruturas da linha média centradas.
Não há evidências de lesões focais detectáveis ao método na fossa posterior.
O IV ventrículo é tópico e tem dimensões normais.
Discretas calcificações ateromatosas nos sifões carotídeos.

CONCLUSÃO: 
Área de encefalomalácia no hemisfério cerebral esquerdo, devendo corresponder a sequela de insulto isquêmico / porencefalia ([facility] antigo). 
Outra provável pequena lesão isquêmica sequelar frontal basal à esquerda ([facility] prévio).
Prováveis focos de gliose por microangiopatia na substância branca supratentorial.
Redução volumétrica do parênquima encefálico.
Ateromatose carotídea.

Nota: prováveis pequenos cistos de retenção / pólipos nos seios maxilares; facectomia prévia bilateralmente.

## 2023-06-03 IMAGING — MR RM CRANIO (ENCEFALO) SEM CONTRASTE
14 of 16 series · 34 of 48 positions shown · non-contrast
Comparison: none

[Series 1: T2-star · axial · 5.0mm · 1.17mm/px · 1 of 15 slices shown (1 of 2)]
[im 1/15]
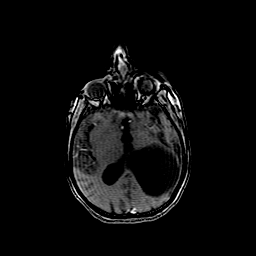

[Series 2: T2-star · axial · 5.0mm · 1.17mm/px · z∈[+1,+167]mm · 2 of 23 slices shown (2 of 2)]
[im 1/23]
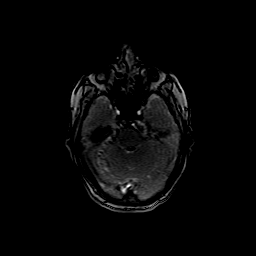
[im 23/23]
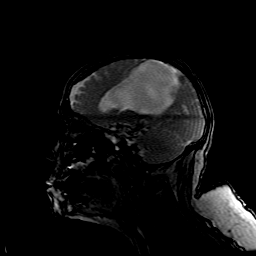

[Series 3: (person_name) · axial · 15.0mm · 7.50mm/px · z∈[-256,+216]mm · 8 of 128 slices shown]
[im 1/128]
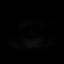
[im 15/128]
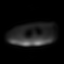
[im 43/128]
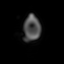
[im 57/128]
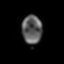
[im 71/128]
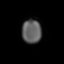
[im 85/128]
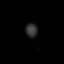
[im 113/128]
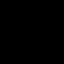
[im 128/128]
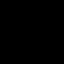

[Series 4: T1 · sagittal · 5.0mm · 0.51mm/px · 2 of 22 slices shown (1 of 2)]
[im 1/22]
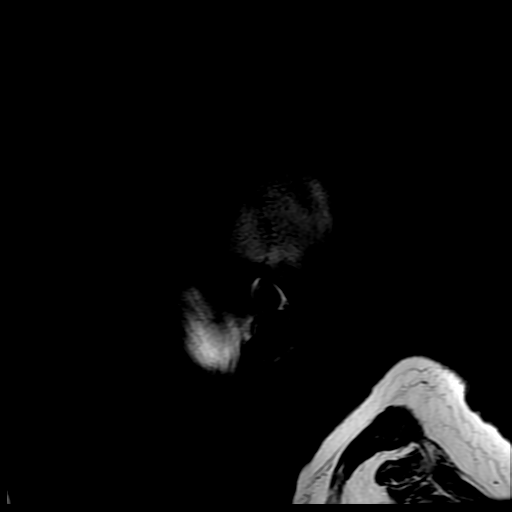
[im 22/22]
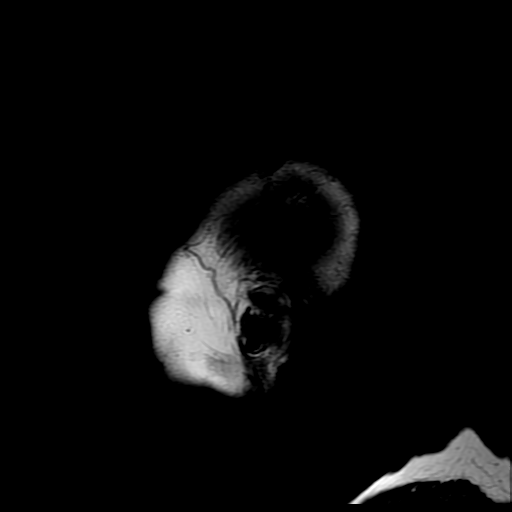

[Series 5: T2 · coronal · 5.0mm · 0.47mm/px · 2 of 26 slices shown (1 of 4)]
[im 1/26]
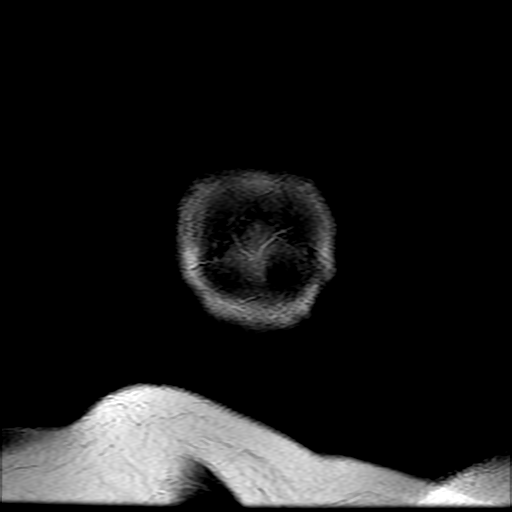
[im 26/26]
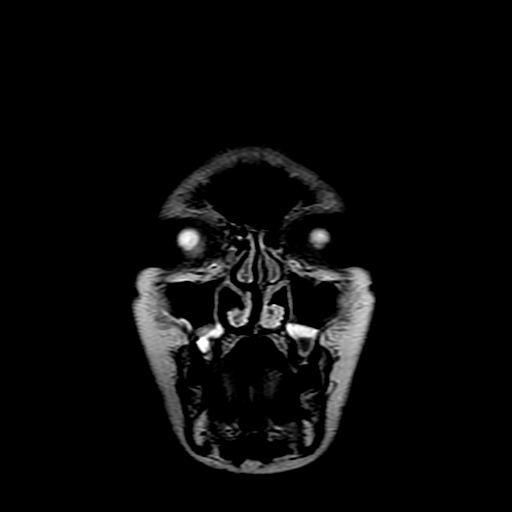

[Series 7: FLAIR · axial · 5.0mm · 0.51mm/px · z∈[-32,+100]mm · 2 of 24 slices shown (1 of 2)]
[im 1/24]
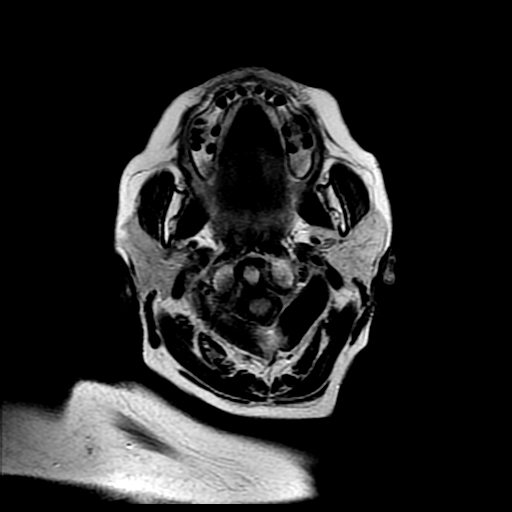
[im 24/24]
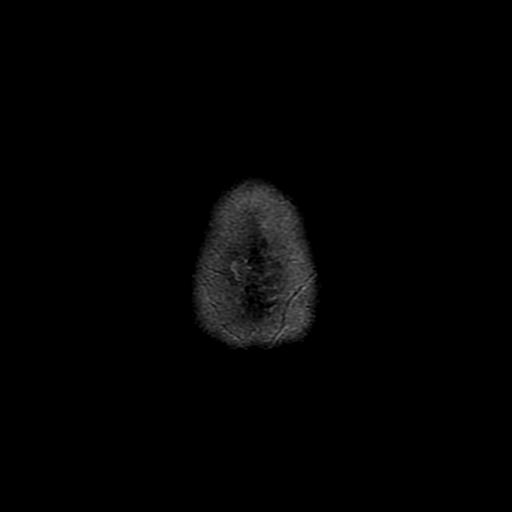

[Series 8: T1 · axial · 5.0mm · 0.51mm/px · z∈[-32,+100]mm · 2 of 24 slices shown (2 of 2)]
[im 1/24]
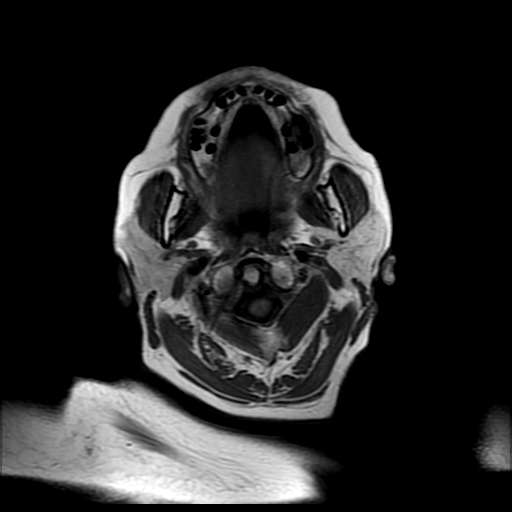
[im 24/24]
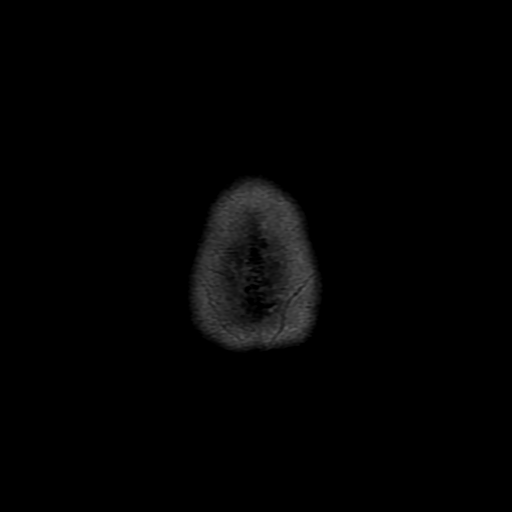

[Series 9: ax difusao · axial · 5.0mm · 1.02mm/px · z∈[-32,+100]mm · 4 of 48 slices shown]
[im 1/48]
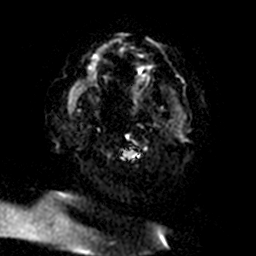
[im 16/48]
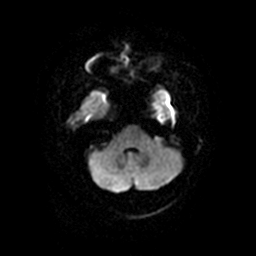
[im 32/48]
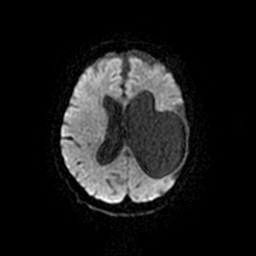
[im 48/48]
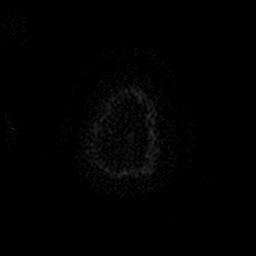

[Series 11: T2 · axial · 5.0mm · 0.51mm/px · z∈[-32,+100]mm · 2 of 24 slices shown (2 of 4)]
[im 1/24]
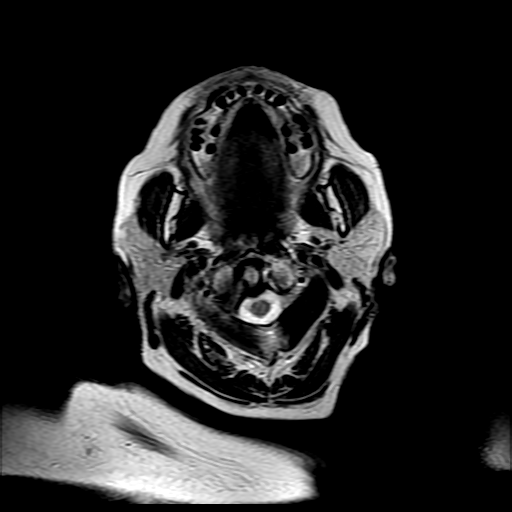
[im 24/24]
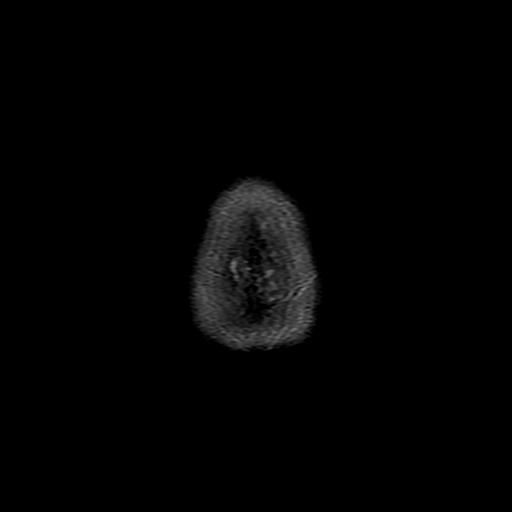

[Series 12: T2 · coronal · 3.0mm · 0.35mm/px · 2 of 24 slices shown (3 of 4)]
[im 1/24]
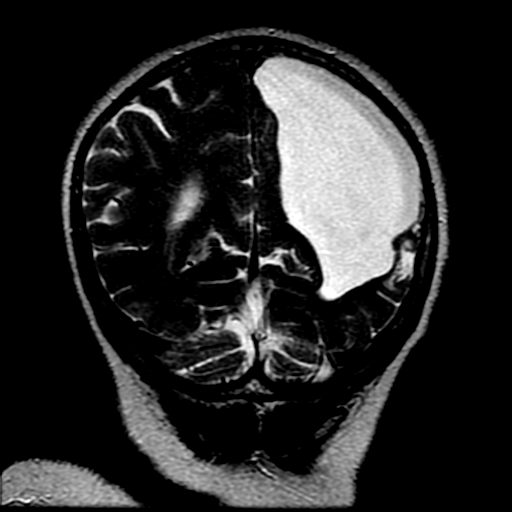
[im 24/24]
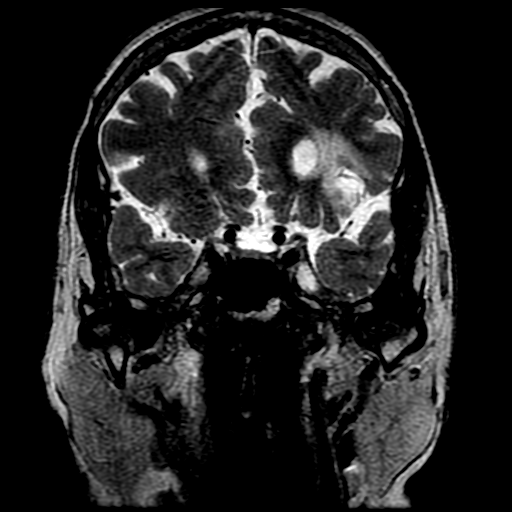

[Series 13: FLAIR · coronal · 3.0mm · 0.35mm/px · 2 of 24 slices shown (2 of 2)]
[im 1/24]
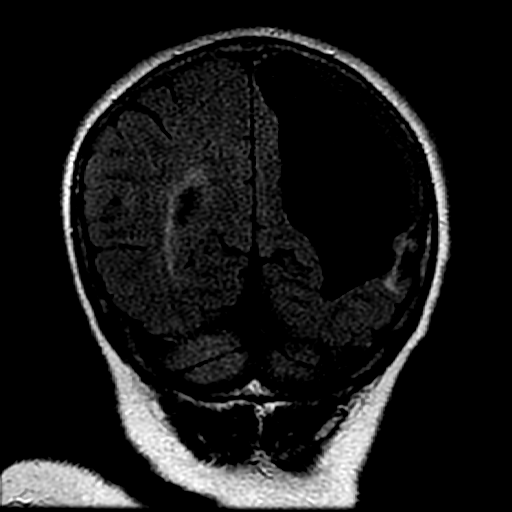
[im 24/24]
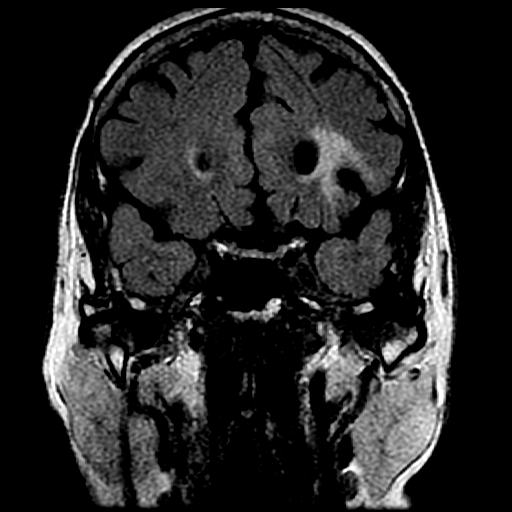

[Series 14: T2 · axial · 2.0mm · 0.39mm/px · 1 of 12 slices shown (4 of 4)]
[im 1/12]
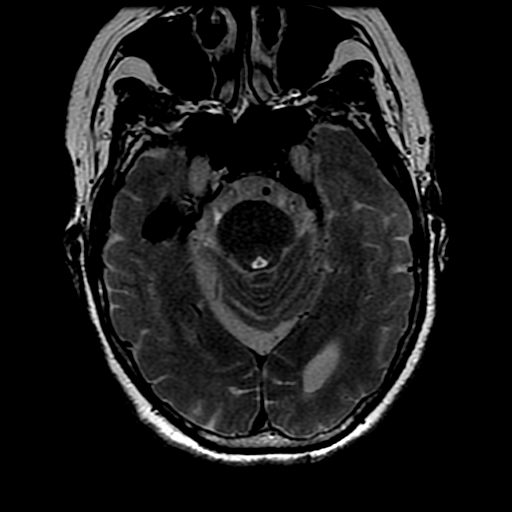

[Series 900: DWI · axial · 5.0mm · 1.02mm/px · z∈[-32,+100]mm · 2 of 24 slices shown (1 of 2)]
[im 1/24]
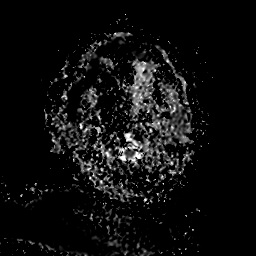
[im 24/24]
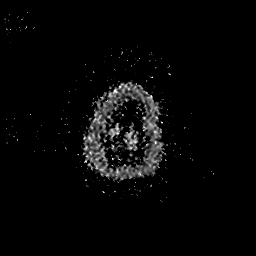

[Series 901: DWI · axial · 5.0mm · 1.02mm/px · z∈[-32,+100]mm · 2 of 24 slices shown (2 of 2)]
[im 1/24]
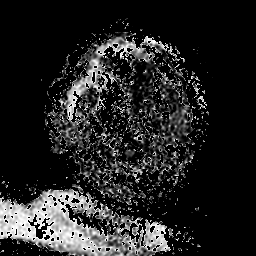
[im 24/24]
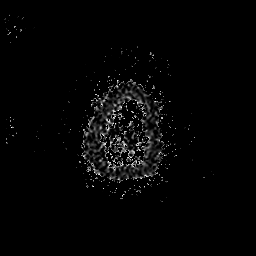

[34 of 48 positions shown; findings below may reference images not displayed]

RESSONÂNCIA MAGNÉTICA DO CRÂNIO

TÉCNICA:
Exame realizado em equipamento de ressonância magnética com sequências, ponderações e planos específicos para o segmento de interesse, sem a administração endovenosa do meio de contraste.

RESULTADO:
Área de encefalomalácia cística frontoparietal esquerda, apresentando comunicação com ventrículo lateral homolateral, sem efeito expansivo significativo, margeada por gliose.
Redução volumétrica encefálica difusa com dilatação compensatória do espaço subaracnoideo.
Focos de hipersinal em T2 distribuídos pela substância branca supratentorial, sem determinar efeito atrófico ou expansivo significativo, sugerindo gliose por microangiopatia.
O restante do parênquima cerebral tem intensidade de sinal normal.
Ausência de hidrocefalia hipertensiva.
Tronco cerebral e cerebelo apresentando forma e intensidade de sinal normal.
Não há sinais de processo expansivo intracraniano.
Corpo caloso de morfologia e espessuras normais.
A sequência eco-planar não evidencia focos de restrição à difusão passiva de água.

CONCLUSÃO:
Área de encefalomalácia cística frontoparietal esquerda, apresentando comunicação com ventrículo lateral homolateral, sem efeito expansivo significativo, margeada por gliose.
Redução volumétrica encefálica difusa, habitual para a faixa etária.
Provável gliose por microangiopatia leve na substância branca supratentorial ([facility]).

## 2023-07-18 ENCOUNTER — Encounter: Payer: MEDICARE | Attending: Hematology & Oncology

## 2023-08-24 ENCOUNTER — Ambulatory Visit: Payer: MEDICARE

## 2023-08-24 ENCOUNTER — Ambulatory Visit: Payer: MEDICARE | Attending: Hematology & Oncology

## 2023-08-31 ENCOUNTER — Inpatient Hospital Stay: Admit: 2023-08-31 | Payer: MEDICARE | Attending: Hematology & Oncology

## 2023-08-31 ENCOUNTER — Ambulatory Visit: Payer: MEDICARE

## 2023-08-31 ENCOUNTER — Ambulatory Visit: Admit: 2023-08-31 | Discharge: 2023-09-06 | Payer: MEDICARE | Attending: Hematology & Oncology

## 2023-08-31 ENCOUNTER — Ambulatory Visit: Admit: 2023-08-31 | Discharge: 2023-08-31 | Payer: MEDICARE

## 2023-08-31 VITALS — BP 166/88 | HR 88 | Temp 98.00000°F | Resp 16 | Ht 62.0 in | Wt 155.0 lb

## 2023-08-31 DIAGNOSIS — C7A090 Malignant carcinoid tumor of the bronchus and lung: Secondary | ICD-10-CM

## 2023-08-31 DIAGNOSIS — C3490 Malignant neoplasm of unspecified part of unspecified bronchus or lung: Secondary | ICD-10-CM

## 2023-08-31 LAB — COMPREHENSIVE METABOLIC PANEL
ALT - Alanine Aminotransferase: 13 [IU]/L (ref 7–52)
AST - Aspartate Aminotransferase: 14 [IU]/L (ref 8–39)
Albumin/Globulin Ratio: 1.1 (ref >0.9)
Albumin: 4.2 g/dL (ref 3.5–5.0)
Alkaline Phosphatase: 64 [IU]/L (ref 34–104)
Anion Gap: 12 mmol/L (ref 4–13)
BUN: 15 mg/dL (ref 6–23)
Bilirubin Total: 0.5 mg/dL (ref 0.3–1.2)
CO2 - Carbon Dioxide: 26 mmol/L (ref 21–31)
Calcium: 9.5 mg/dL (ref 8.6–10.3)
Chloride: 101 mmol/L (ref 98–111)
Creatinine: 0.75 mg/dL (ref 0.55–1.10)
Globulin: 3.7 g/dL (ref 2.0–3.7)
Glomerular Filtration Rate Estimate (Female): >60 mL/min/{1.73_m2} (ref >=60)
Glucose: 130 mg/dL — ABNORMAL HIGH (ref 80–99)
Potassium: 3.9 mmol/L (ref 3.5–5.1)
Protein Total: 7.9 g/dL (ref 6.0–8.0)
Sodium: 139 mmol/L (ref 135–143)

## 2023-08-31 LAB — CBC WITH AUTO DIFFERENTIAL
Basophils %: 0 % (ref 0–2)
Basophils, Absolute: 0 10*3/ÂµL (ref 0.0–0.2)
Eosinophils %: 0 % (ref 0–7)
Eosinophils, Absolute: 0 10*3/ÂµL (ref 0.0–0.7)
HCT: 39.2 % (ref 37.0–48.0)
Hemoglobin: 13.1 g/dL (ref 12.0–16.0)
Lymphocytes %: 18 % — ABNORMAL LOW (ref 25–45)
Lymphocytes, Absolute: 2.1 10*3/ÂµL (ref 1.1–4.3)
MCH: 29.8 pg (ref 27.0–34.0)
MCHC: 33.5 g/dL (ref 32.0–36.0)
MCV: 89 fL (ref 81.0–99.0)
MPV: 8 fL (ref 7.4–10.4)
Monocytes %: 3 % (ref 0–12)
Monocytes, Absolute: 0.4 10*3/ÂµL (ref 0.0–1.2)
Neutrophils %: 78 % — ABNORMAL HIGH (ref 35–70)
Neutrophils, Absolute: 9.3 10*3/ÂµL — ABNORMAL HIGH (ref 1.6–7.3)
Platelet Count: 383 10*3/ÂµL (ref 150–400)
RBC: 4.41 10*6/ÂµL (ref 4.20–5.40)
RDW: 13.5 % (ref 11.5–14.5)
WBC: 11.9 10*3/ÂµL — ABNORMAL HIGH (ref 4.8–10.8)

## 2023-08-31 LAB — LDH: LD: 173 [IU]/L (ref 100–220)

## 2023-08-31 MED ORDER — iopamidoL (ISOVUE-370) 370 mg iodine /mL (76 %) injection 80 mL
370 | Freq: Once | INTRAVENOUS | Status: AC | PRN
Start: 2023-08-31 — End: 2023-08-31
  Administered 2023-08-31: 21:00:00 370 mL via INTRAVENOUS

## 2023-08-31 NOTE — Progress Notes (Signed)
 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

## 2024-08-28 ENCOUNTER — Ambulatory Visit: Payer: MEDICARE | Attending: Hematology & Oncology

## 2024-08-29 ENCOUNTER — Inpatient Hospital Stay: Admit: 2024-08-29 | Discharge: 2024-08-29 | Payer: MEDICARE | Attending: Hematology & Oncology

## 2024-08-29 ENCOUNTER — Ambulatory Visit: Admit: 2024-08-29 | Discharge: 2024-08-29 | Payer: MEDICARE

## 2024-08-29 ENCOUNTER — Ambulatory Visit: Admit: 2024-08-29 | Discharge: 2024-09-06 | Payer: MEDICARE | Attending: Hematology & Oncology

## 2024-08-29 VITALS — BP 110/66 | HR 80 | Temp 97.20000°F | Resp 16 | Ht 62.0 in | Wt 136.6 lb

## 2024-08-29 DIAGNOSIS — C7A090 Malignant carcinoid tumor of the bronchus and lung: Secondary | ICD-10-CM

## 2024-08-29 LAB — CBC WITH AUTO DIFFERENTIAL
Basophils %: 1 % (ref 0–2)
Basophils, Absolute: 0.1 10*3/ÂµL (ref 0.0–0.2)
Eosinophils %: 3 % (ref 0–7)
Eosinophils, Absolute: 0.2 10*3/ÂµL (ref 0.0–0.7)
HCT: 40.7 % (ref 37.0–48.0)
Hemoglobin: 13.9 g/dL (ref 12.0–16.0)
Lymphocytes %: 52 % — ABNORMAL HIGH (ref 25–45)
Lymphocytes, Absolute: 3.4 10*3/ÂµL (ref 1.1–4.3)
MCH: 30.8 pg (ref 27.0–34.0)
MCHC: 34.1 g/dL (ref 32.0–36.0)
MCV: 90.4 fL (ref 81.0–99.0)
MPV: 8 fL (ref 7.4–10.4)
Monocytes %: 10 % (ref 0–12)
Monocytes, Absolute: 0.7 10*3/ÂµL (ref 0.0–1.2)
Neutrophils %: 35 % (ref 35–70)
Neutrophils, Absolute: 2.3 10*3/ÂµL (ref 1.6–7.3)
Platelet Count: 282 10*3/ÂµL (ref 150–400)
RBC: 4.51 10*6/ÂµL (ref 4.20–5.40)
RDW: 15.5 % — ABNORMAL HIGH (ref 11.5–14.5)
WBC: 6.6 10*3/ÂµL (ref 4.8–10.8)

## 2024-08-29 LAB — COMPREHENSIVE METABOLIC PANEL
ALT - Alanine Aminotransferase: 20 IU/L (ref 7–52)
AST - Aspartate Aminotransferase: 21 IU/L (ref 8–39)
Albumin/Globulin Ratio: 2 (ref >0.9)
Albumin: 4.6 g/dL (ref 3.5–5.0)
Alkaline Phosphatase: 47 IU/L (ref 34–104)
Anion Gap: 4 mmol/L (ref 4–13)
BUN: 19 mg/dL (ref 6–23)
Bilirubin Total: 0.5 mg/dL (ref 0.3–1.2)
CO2 - Carbon Dioxide: 31 mmol/L (ref 21–31)
Calcium: 9.4 mg/dL (ref 8.6–10.3)
Chloride: 104 mmol/L (ref 98–111)
Creatinine: 0.69 mg/dL (ref 0.55–1.10)
Globulin: 2.3 g/dL (ref 2.0–3.7)
Glomerular Filtration Rate Estimate (Female): >60 mL/min/1.73m*2 (ref >=60)
Glucose: 80 mg/dL (ref 80–99)
Potassium: 4.4 mmol/L (ref 3.5–5.1)
Protein Total: 6.9 g/dL (ref 6.0–8.0)
Sodium: 139 mmol/L (ref 135–143)

## 2024-08-29 LAB — LDH: LD: 206 IU/L (ref 100–220)

## 2024-08-29 MED ORDER — IOPAMIDOL 370 MG IODINE/ML (76 %) INTRAVENOUS SOLUTION
370 | Freq: Once | INTRAVENOUS | Status: AC | PRN
Start: 2024-08-29 — End: 2024-08-29
  Administered 2024-08-29: 18:00:00 370 mL via INTRAVENOUS

## 2024-08-29 NOTE — Progress Notes (Signed)
 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 Bauch is a 73 year old woman with history of neuroendocrine tumor of the lung diagnosed in 2014.  She states that since last visit, she has been feeling well.  Her energy level is good.  She remains very active.  She tells me that she generally walks about 2 miles a day.  She had some abdominal discomfort last week for a few days but then that resolved.  She has had no chest pain, shortness of breath or new bone pains.  She has been working on weight loss.  She has had no fevers.  She has had no issues with nausea.  She had a CT scan of the chest abdomen pelvis completed earlier today.  I personally reviewed the film myself.  This shows nothing concerning for new or metastatic disease.  There are some findings that have not changed in years.    Past Medical History:   Diagnosis Date    Carcinoma of the skin, basal cell     left leg    Chronic pain     knees    Diverticulitis     Hemorrhoids     History of colonic polyps     History of migraine headaches     Lung nodule     Osteoarthritis     Osteopenia     PONV (postoperative nausea and vomiting)         Tobacco Use History[1]      Current Medications[2]     Review of systems:    As above.  There are no additional skin, blood, ear, eye, head, neck, lymph node, cardiovascular, respiratory, GI, GU, musculoskeletal, neurologic or psychiatric complaints.     Vitals:    08/29/24 1323   BP: 110/66   Pulse: 80   Resp: 16   Temp: 36.2 ?C (97.2 ?F)   SpO2: 97%        Physical Examination:     Constitutional:  Well appearing.  In no apparent distress.  Appears close to chronologic age.  Head: Normocephalic.  Atraumatic.  Eyes : Conjunctivae and sclerae are  clear and without icterus. Pupils are equal.  ENMT : Sinuses non-tender.  No oral exudates, ulcers, masses, thrush or mucositis. Oropharynx clear. Tongue normal.   Neck : Supple without masses.   Lymphatic : No tender or palpable cervical, supraclavicular, axillary or inguinal lymphadenopathy.  Chest:  Symmetric.  No masses.  Respiratory: Lungs are clear to auscultation and percussion without crackles, wheezing, or rhonchi.   Cardiovascular: Regular rate and rhythm of heart without murmurs, rubs or gallops.  Abdomen: Positive bowel sounds, non-distended, soft, non-tender, no masses. No guarding or rebound tenderness. No hepatomegaly or splenomegaly.  Back/Spine: No kyphosis or scoliosis. Non-tender to palpation.  Extremities: No visible deformities, no cyanosis, clubbing or edema.  lntegumentary: No petechiae, erythema, rashes or ecchymoses.  Neurologic : Cranial nerves intact.  No motor deficits.  Normal cerebellar function.  Normal gait.  Psychiatric: Alert and oriented times three. Coherent speech. Verbalizes understanding of our discussions today.    Labs:   Outpatient Lab on  08/29/2024   Component Date Value    Sodium 08/29/2024 139     Potassium 08/29/2024 4.4     Chloride 08/29/2024 104     CO2 - Carbon Dioxide 08/29/2024 31     Glucose 08/29/2024 80     BUN 08/29/2024 19     Creatinine 08/29/2024 0.69     Calcium 08/29/2024 9.4     AST - Aspartate Aminotra* 08/29/2024 21     ALT - Alanine Aminotrans* 08/29/2024 20     Alkaline Phosphatase 08/29/2024 47     Bilirubin Total 08/29/2024 0.5     Protein Total 08/29/2024 6.9     Albumin 08/29/2024 4.6     Globulin 08/29/2024 2.3     Albumin/Globulin Ratio 08/29/2024 2.0     Anion Gap 08/29/2024 4     Glomerular Filtration Ra* 08/29/2024 >60     GFR Additional Info 08/29/2024      WBC 08/29/2024 6.6     RBC 08/29/2024 4.51     Hemoglobin 08/29/2024 13.9     HCT 08/29/2024 40.7     MCV 08/29/2024 90.4     MCH 08/29/2024 30.8     MCHC 08/29/2024 34.1     RDW  08/29/2024 15.5 (H)     Platelet Count 08/29/2024 282     MPV 08/29/2024 8.0     Neutrophils % 08/29/2024 35     Lymphocytes % 08/29/2024 52 (H)     Monocytes % 08/29/2024 10     Eosinophils % 08/29/2024 3     Basophils % 08/29/2024 1     Neutrophils, Absolute 08/29/2024 2.3     Lymphocytes, Absolute 08/29/2024 3.4     Monocytes, Absolute 08/29/2024 0.7     Eosinophils, Absolute 08/29/2024 0.2     Basophils, Absolute 08/29/2024 0.1     Differential Type 08/29/2024 Automated Differential     LD 08/29/2024 206        Imaging:  No results found.      Ongoing Care Management  ECOG: Grade 0-5: 0 - Fully active, able to carry on all pre-disease performance without restriction  Pain level: Pain Score: 0-No pain  Depression Screen:        Patient demonstrated comprehension of the information provided regarding their disease and treatment plan and was afforded the opportunity to ask questions about the diagnosis and treatment options.      Assessment & Plan:  1.  Neuroendocrine tumor: Ms. 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  The disease had a low Ki-67 and she has been followed with scans.  She is without recurrence.  Her CBC and CMP look fine.  The scan looks good.  She is now 11 years out and is without evidence of recurrence.  The chance of recurrence is very low.  I do not feel that she needs to follow-up with us  any longer.  We will therefore discharge her from clinic.    2.  Lymphocytosis: Patient has a very mild lymphocytosis with a normal CBC otherwise.  She did have an illness last week and that is likely the cause.  I do not feel that this needs to be worked up.  If there were any questions I would be happy to answer them but this is something that can be followed by her primary care provider.      Rydge Texidor T. Juliet, MD  Copy: Juliane L Leighton  Giorgos Loizidis, MD     Our clinic is responsible for  ongoing medical care with consistency and continuity over time, serving as a continuing focal point of healthcare services related to a serious and complex condition.    We retain the right to modify this information without notice in the event of errors. Documentation was via Dragon Transcription program and occasional errors in punctuation, grammar, and content may occur.  The patient has been advised to follow up with appointments. Patient has been informed that non-compliance with this recommendation could result in serious consequences because of delays in diagnosis and treatment. It also has been disclosed to the patient that we expect patients to take an active role in their health care and never assume that no news is good news.         [1]   Social History  Tobacco Use   Smoking Status Never    Passive exposure: Yes   Smokeless Tobacco Never   [2]   Current Outpatient Medications:     acetaminophen  (TYLENOL ) 325 mg tablet, Take 650 mg by mouth every 6 hours as needed for Pain., Disp: , Rfl:     polyethylene glycol (MIRALAX ) 17 gram packet, Take 1 packet by mouth 2 times daily. Take while using narcotic pain medications to prevent constipation. If loose stools decrease to once a day. (Patient not taking: Reported on 08/29/2024), Disp: 28 each, Rfl: 0    predniSONE (DELTASONE) 1 mg tablet, Take 7 mg by mouth once daily. (Patient not taking: Reported on 08/29/2024), Disp: , Rfl:   No current facility-administered medications for this visit.

## 2024-08-30 ENCOUNTER — Ambulatory Visit: Payer: MEDICARE

## 2024-09-02 ENCOUNTER — Ambulatory Visit: Payer: MEDICARE | Attending: Hematology & Oncology
